# Patient Record
Sex: Male | Born: 1960 | Race: Black or African American | Hispanic: No | Marital: Married | Smoking: Never smoker
Health system: Southern US, Community
[De-identification: ages and names within clinical notes are randomized; demographics above are authoritative.]

## PROBLEM LIST (undated history)

## (undated) DIAGNOSIS — I10 Essential (primary) hypertension: Secondary | ICD-10-CM

## (undated) DIAGNOSIS — J45909 Unspecified asthma, uncomplicated: Secondary | ICD-10-CM

## (undated) HISTORY — PX: HERNIA REPAIR: SHX51

---

## 2004-10-04 ENCOUNTER — Emergency Department: Payer: Self-pay | Admitting: Emergency Medicine

## 2004-10-13 ENCOUNTER — Ambulatory Visit: Payer: Self-pay | Admitting: Family Medicine

## 2004-10-22 ENCOUNTER — Ambulatory Visit: Payer: Self-pay | Admitting: Family Medicine

## 2006-05-25 ENCOUNTER — Ambulatory Visit: Payer: Self-pay | Admitting: Family Medicine

## 2006-06-04 ENCOUNTER — Ambulatory Visit: Payer: Self-pay | Admitting: Family Medicine

## 2006-06-11 ENCOUNTER — Ambulatory Visit: Payer: Self-pay | Admitting: Family Medicine

## 2006-12-28 ENCOUNTER — Encounter (INDEPENDENT_AMBULATORY_CARE_PROVIDER_SITE_OTHER): Payer: Self-pay | Admitting: *Deleted

## 2007-01-31 ENCOUNTER — Telehealth: Payer: Self-pay | Admitting: Family Medicine

## 2007-03-01 ENCOUNTER — Encounter: Admission: RE | Admit: 2007-03-01 | Discharge: 2007-03-01 | Payer: Self-pay | Admitting: Internal Medicine

## 2007-05-18 ENCOUNTER — Emergency Department: Payer: Self-pay

## 2008-01-11 ENCOUNTER — Ambulatory Visit: Payer: Self-pay | Admitting: Orthopedic Surgery

## 2011-01-11 ENCOUNTER — Emergency Department: Payer: Self-pay | Admitting: Emergency Medicine

## 2011-12-01 ENCOUNTER — Emergency Department: Payer: Self-pay | Admitting: Emergency Medicine

## 2015-02-11 ENCOUNTER — Other Ambulatory Visit: Payer: Self-pay | Admitting: Internal Medicine

## 2015-02-12 ENCOUNTER — Encounter: Payer: Self-pay | Admitting: Internal Medicine

## 2015-02-12 ENCOUNTER — Other Ambulatory Visit: Payer: Self-pay | Admitting: Internal Medicine

## 2015-02-12 DIAGNOSIS — I10 Essential (primary) hypertension: Secondary | ICD-10-CM | POA: Insufficient documentation

## 2015-05-24 ENCOUNTER — Other Ambulatory Visit: Payer: Self-pay | Admitting: Internal Medicine

## 2015-05-28 ENCOUNTER — Ambulatory Visit (INDEPENDENT_AMBULATORY_CARE_PROVIDER_SITE_OTHER): Payer: BLUE CROSS/BLUE SHIELD | Admitting: Internal Medicine

## 2015-05-28 ENCOUNTER — Encounter: Payer: Self-pay | Admitting: Internal Medicine

## 2015-05-28 VITALS — BP 157/98 | HR 68 | Ht 72.0 in | Wt 223.0 lb

## 2015-05-28 DIAGNOSIS — Z1211 Encounter for screening for malignant neoplasm of colon: Secondary | ICD-10-CM

## 2015-05-28 DIAGNOSIS — I10 Essential (primary) hypertension: Secondary | ICD-10-CM

## 2015-05-28 MED ORDER — AMLODIPINE BESYLATE 5 MG PO TABS: 5.0000 mg | ORAL_TABLET | Freq: Every day | ORAL | Status: DC

## 2015-05-28 NOTE — Progress Notes (Signed)
Date:  05/28/2015   Name:  Alexander Davis   DOB:  03/10/1961   MRN:  [id number]   Chief Complaint: Follow-up and Hypertension Hypertension This is a recurrent problem. The current episode started more than 1 month ago. The problem has been gradually worsening since onset. The problem is uncontrolled. Pertinent negatives include no chest pain, headaches, palpitations or shortness of breath. Associated agents: herbal supplements. There are no known risk factors for coronary artery disease. Past treatments include calcium channel blockers and angiotensin blockers. The current treatment provides significant improvement.  He had been taking amlodipine and losartan up until about one year ago. He been exercising and watching his sodium intake and controlling his weight. His blood pressures were excellent. When he ran out of medication he just neglected to refill them. He continued to work on diet and exercise and monitor blood pressures. These remained 140/90 until 3 months ago. At that time he came under increased stress with the passing of his sister and his father as well as the loss of his job. He stopped exercising and neglected his diet. Several days ago he had an episode of lightheadedness which prompted him to check his blood pressure.    Review of Systems  Constitutional: Negative for fever, diaphoresis and unexpected weight change.  Eyes: Negative for visual disturbance.  Respiratory: Negative for chest tightness, shortness of breath and wheezing.   Cardiovascular: Negative for chest pain, palpitations and leg swelling.  Gastrointestinal: Negative for abdominal pain.  Neurological: Positive for light-headedness. Negative for headaches.    Patient Active Problem List   Diagnosis Date Noted  . Hypertension 02/12/2015    Prior to Admission medications   Medication Sig Start Date End Date Taking? Authorizing Provider  amLODipine (NORVASC) 5 MG tablet Take 1 tablet by mouth  daily. Reported on 05/28/2015 08/30/13   Historical Provider, MD  losartan (COZAAR) 100 MG tablet Take 1 tablet by mouth daily. Reported on 05/28/2015 08/30/13   Historical Provider, MD    Allergies  Allergen Reactions  . Penicillins Anaphylaxis    Past Surgical History  Procedure Laterality Date  . Hernia repair      age 32    Social History  Substance Use Topics  . Smoking status: Never Smoker   . Smokeless tobacco: None  . Alcohol Use: No     Medication list has been reviewed and updated.   Physical Exam  Constitutional: He is oriented to person, place, and time. He appears well-developed. No distress.  HENT:  Head: Normocephalic and atraumatic.  Right Ear: Tympanic membrane and ear canal normal.  Left Ear: Tympanic membrane and ear canal normal.  Mouth/Throat: Oropharynx is clear and moist.  Eyes: EOM are normal. Pupils are equal, round, and reactive to light.  Neck: Normal range of motion. Neck supple. No thyromegaly present.  Cardiovascular: Normal rate, regular rhythm and normal heart sounds.   Pulmonary/Chest: Effort normal and breath sounds normal. No respiratory distress. He has no wheezes.  Musculoskeletal: Normal range of motion. He exhibits no edema or tenderness.  Neurological: He is alert and oriented to person, place, and time. He has normal reflexes.  Skin: Skin is warm and dry. No rash noted.  Psychiatric: He has a normal mood and affect. His behavior is normal. Thought content normal.    BP 157/98 mmHg  Pulse 68  Ht 6' (1.829 m)  Wt 223 lb (101.152 kg)  BMI 30.24 kg/m2  Assessment and Plan: 1. Essential hypertension Resume single  agent and reassess next visit Avoid stimulants and herbal supplements  Bring labs done recently at work to next visit - amLODipine (NORVASC) 5 MG tablet; Take 1 tablet (5 mg total) by mouth daily. Reported on 05/28/2015  Dispense: 30 tablet; Refill: 3  2. Colon cancer screening Pt has colonoscopy scheduled next  month  Halina Maidens, MD [facility]  05/28/2015

## 2015-05-28 NOTE — Patient Instructions (Signed)
DASH Eating Plan  DASH stands for "Dietary Approaches to Stop Hypertension." The DASH eating plan is a healthy eating plan that has been shown to reduce high blood pressure (hypertension). Additional health benefits may include reducing the risk of type 2 diabetes mellitus, heart disease, and stroke. The DASH eating plan may also help with weight loss.  WHAT DO I NEED TO KNOW ABOUT THE DASH EATING PLAN?  For the DASH eating plan, you will follow these general guidelines:  · Choose foods with a percent daily value for sodium of less than 5% (as listed on the food label).  · Use salt-free seasonings or herbs instead of table salt or sea salt.  · Check with your health care provider or pharmacist before using salt substitutes.  · Eat lower-sodium products, often labeled as "lower sodium" or "no salt added."  · Eat fresh foods.  · Eat more vegetables, fruits, and low-fat dairy products.  · Choose whole grains. Look for the word "whole" as the first word in the ingredient list.  · Choose fish and skinless chicken or turkey more often than red meat. Limit fish, poultry, and meat to 6 oz (170 g) each day.  · Limit sweets, desserts, sugars, and sugary drinks.  · Choose heart-healthy fats.  · Limit cheese to 1 oz (28 g) per day.  · Eat more home-cooked food and less restaurant, buffet, and fast food.  · Limit fried foods.  · Cook foods using methods other than frying.  · Limit canned vegetables. If you do use them, rinse them well to decrease the sodium.  · When eating at a restaurant, ask that your food be prepared with less salt, or no salt if possible.  WHAT FOODS CAN I EAT?  Seek help from a dietitian for individual calorie needs.  Grains  Whole grain or whole wheat bread. Brown rice. Whole grain or whole wheat pasta. Quinoa, bulgur, and whole grain cereals. Low-sodium cereals. Corn or whole wheat flour tortillas. Whole grain cornbread. Whole grain crackers. Low-sodium crackers.  Vegetables  Fresh or frozen vegetables  (raw, steamed, roasted, or grilled). Low-sodium or reduced-sodium tomato and vegetable juices. Low-sodium or reduced-sodium tomato sauce and paste. Low-sodium or reduced-sodium canned vegetables.   Fruits  All fresh, canned (in natural juice), or frozen fruits.  Meat and Other Protein Products  Ground beef (85% or leaner), grass-fed beef, or beef trimmed of fat. Skinless chicken or turkey. Ground chicken or turkey. Pork trimmed of fat. All fish and seafood. Eggs. Dried beans, peas, or lentils. Unsalted nuts and seeds. Unsalted canned beans.  Dairy  Low-fat dairy products, such as skim or 1% milk, 2% or reduced-fat cheeses, low-fat ricotta or cottage cheese, or plain low-fat yogurt. Low-sodium or reduced-sodium cheeses.  Fats and Oils  Tub margarines without trans fats. Light or reduced-fat mayonnaise and salad dressings (reduced sodium). Avocado. Safflower, olive, or canola oils. Natural peanut or almond butter.  Other  Unsalted popcorn and pretzels.  The items listed above may not be a complete list of recommended foods or beverages. Contact your dietitian for more options.  WHAT FOODS ARE NOT RECOMMENDED?  Grains  White bread. White pasta. White rice. Refined cornbread. Bagels and croissants. Crackers that contain trans fat.  Vegetables  Creamed or fried vegetables. Vegetables in a cheese sauce. Regular canned vegetables. Regular canned tomato sauce and paste. Regular tomato and vegetable juices.  Fruits  Dried fruits. Canned fruit in light or heavy syrup. Fruit juice.  Meat and Other Protein   Products  Fatty cuts of meat. Ribs, chicken wings, bacon, sausage, bologna, salami, chitterlings, fatback, hot dogs, bratwurst, and packaged luncheon meats. Salted nuts and seeds. Canned beans with salt.  Dairy  Whole or 2% milk, cream, half-and-half, and cream cheese. Whole-fat or sweetened yogurt. Full-fat cheeses or blue cheese. Nondairy creamers and whipped toppings. Processed cheese, cheese spreads, or cheese  curds.  Condiments  Onion and garlic salt, seasoned salt, table salt, and sea salt. Canned and packaged gravies. Worcestershire sauce. Tartar sauce. Barbecue sauce. Teriyaki sauce. Soy sauce, including reduced sodium. Steak sauce. Fish sauce. Oyster sauce. Cocktail sauce. Horseradish. Ketchup and mustard. Meat flavorings and tenderizers. Bouillon cubes. Hot sauce. Tabasco sauce. Marinades. Taco seasonings. Relishes.  Fats and Oils  Butter, stick margarine, lard, shortening, ghee, and bacon fat. Coconut, palm kernel, or palm oils. Regular salad dressings.  Other  Pickles and olives. Salted popcorn and pretzels.  The items listed above may not be a complete list of foods and beverages to avoid. Contact your dietitian for more information.  WHERE CAN I FIND MORE INFORMATION?  National Heart, Lung, and [employer] Institute: www.nhlbi.nih.gov/health/health-topics/topics/dash/     This information is not intended to replace advice given to you by your health care provider. Make sure you discuss any questions you have with your health care provider.     Document Released: 04/02/2011 Document Revised: 05/04/2014 Document Reviewed: 02/15/2013  Elsevier Interactive Patient Education ©2016 Elsevier Inc[employer]

## 2015-06-19 ENCOUNTER — Encounter: Payer: Self-pay | Admitting: Internal Medicine

## 2015-06-19 ENCOUNTER — Ambulatory Visit (INDEPENDENT_AMBULATORY_CARE_PROVIDER_SITE_OTHER): Payer: BLUE CROSS/BLUE SHIELD | Admitting: Internal Medicine

## 2015-06-19 VITALS — BP 124/88 | HR 76 | Ht 72.0 in | Wt 223.0 lb

## 2015-06-19 DIAGNOSIS — N451 Epididymitis: Secondary | ICD-10-CM

## 2015-06-19 MED ORDER — CIPROFLOXACIN HCL 500 MG PO TABS: 500.0000 mg | ORAL_TABLET | Freq: Two times a day (BID) | ORAL | Status: DC

## 2015-06-19 NOTE — Patient Instructions (Signed)
Epididymitis °Epididymitis is swelling (inflammation) of the epididymis. The epididymis is a cord-like structure that is located along the top and back part of the testicle. It collects and stores sperm from the testicle. °This condition can also cause pain and swelling of the testicle and scrotum. Symptoms usually start suddenly (acute epididymitis). Sometimes epididymitis starts gradually and lasts for a while (chronic epididymitis). This type may be harder to treat. °CAUSES °In men 35 and younger, this condition is usually caused by a bacterial infection or sexually transmitted disease (STD), such as: °· Gonorrhea. °· Chlamydia.   °In men 35 and older who do not have anal sex, this condition is usually caused by bacteria from a blockage or abnormalities in the urinary system. These can result from: °· Having a tube placed into the bladder (urinary catheter). °· Having an enlarged or inflamed prostate gland. °· Having recent urinary tract surgery. °In men who have a condition that weakens the body's defense system (immune system), such as HIV, this condition can be caused by:  °· Other bacteria, including tuberculosis and syphilis. °· Viruses. °· Fungi. °Sometimes this condition occurs without infection. That may happen if urine flows backward into the epididymis after heavy lifting or straining. °RISK FACTORS °This condition is more likely to develop in men: °· Who have unprotected sex with more than one partner. °· Who have anal sex.   °· Who have recently had surgery.   °· Who have a urinary catheter. °· Who have urinary problems. °· Who have a suppressed immune system.   °SYMPTOMS  °This condition usually begins suddenly with chills, fever, and pain behind the scrotum and in the testicle. Other symptoms include:  °· Swelling of the scrotum, testicle, or both. °· Pain when ejaculating or urinating. °· Pain in the back or belly. °· Nausea. °· Itching and discharge from the penis. °· Frequent need to pass  urine. °· Redness and tenderness of the scrotum. °DIAGNOSIS °Your health care provider can diagnose this condition based on your symptoms and medical history. Your health care provider will also do a physical exam to ask about your symptoms and check your scrotum and testicle for swelling, pain, and redness. You may also have other tests, including:   °· Examination of discharge from the penis. °· Urine tests for infections, such as STDs.   °Your health care provider may test you for other STDs, including HIV.  °TREATMENT °Treatment for this condition depends on the cause. If your condition is caused by a bacterial infection, oral antibiotic medicine may be prescribed. If the bacterial infection has spread to your blood, you may need to receive IV antibiotics. Nonbacterial epididymitis is treated with home care that includes bed rest and elevation of the scrotum. °Surgery may be needed to treat: °· Bacterial epididymitis that causes pus to build up in the scrotum (abscess). °· Chronic epididymitis that has not responded to other treatments. °HOME CARE INSTRUCTIONS °Medicines  °· Take over-the-counter and prescription medicines only as told by your health care provider.   °· If you were prescribed an antibiotic medicine, take it as told by your health care provider. Do not stop taking the antibiotic even if your condition improves. °Sexual Activity  °· If your epididymitis was caused by an STD, avoid sexual activity until your treatment is complete. °· Inform your sexual partner or partners if you test positive for an STD. They may need to be treated. Do not engage in sexual activity with your partner or partners until their treatment is completed. °General Instructions  °· Return to your normal activities as told   by your health care provider. Ask your health care provider what activities are safe for you. °· Keep your scrotum elevated and supported while resting. Ask your health care provider if you should wear a  scrotal support, such as a jockstrap. Wear it as told by your health care provider. °· If directed, apply ice to the affected area:   °¨ Put ice in a plastic bag. °¨ Place a towel between your skin and the bag. °¨ Leave the ice on for 20 minutes, 2-3 times per day. °· Try taking a sitz bath to help with discomfort. This is a warm water bath that is taken while you are sitting down. The water should only come up to your hips and should cover your buttocks. Do this 3-4 times per day or as told by your health care provider. °· Keep all follow-up visits as told by your health care provider. This is important. °SEEK MEDICAL CARE IF:  °· You have a fever.   °· Your pain medicine is not helping.   °· Your pain is getting worse.   °· Your symptoms do not improve within three days. °  °This information is not intended to replace advice given to you by your health care provider. Make sure you discuss any questions you have with your health care provider. °  °Document Released: 04/10/2000 Document Revised: 01/02/2015 Document Reviewed: 08/29/2014 °Elsevier Interactive Patient Education ©2016 Elsevier Inc. ° °

## 2015-06-19 NOTE — Progress Notes (Signed)
    Date:  06/19/2015   Name:  Alexander Davis   DOB:  [birth year]   MRN:  [id number]   Chief Complaint: Testicle Pain Testicle Pain The patient's primary symptoms include testicular pain. The patient's pertinent negatives include no genital injury, genital itching, genital lesions, penile discharge, penile pain, priapism or scrotal swelling. This is a new problem. The problem occurs intermittently. The problem has been waxing and waning. The pain is mild. Pertinent negatives include no chest pain, chills, coughing, discolored urine, dysuria, fever, flank pain, hematuria, painful intercourse, rash, shortness of breath, urgency or urinary retention. There is no reported injury. The testicular pain affects the left testicle. The color of the testicles is normal. Nothing aggravates the symptoms. He has tried nothing for the symptoms.    Review of Systems  Constitutional: Negative for fever, chills, diaphoresis and fatigue.  HENT: Negative for hearing loss.   Eyes: Negative for visual disturbance.  Respiratory: Negative for cough, chest tightness and shortness of breath.   Cardiovascular: Negative for chest pain and palpitations.  Genitourinary: Positive for testicular pain. Negative for dysuria, urgency, hematuria, flank pain, decreased urine volume, discharge, penile swelling, scrotal swelling, genital sores and penile pain.  Musculoskeletal: Negative for back pain.  Skin: Negative for rash.    Patient Active Problem List   Diagnosis Date Noted  . Hypertension 02/12/2015    Prior to Admission medications   Medication Sig Start Date End Date Taking? Authorizing Provider  amLODipine (NORVASC) 5 MG tablet Take 1 tablet (5 mg total) by mouth daily. Reported on 05/28/2015 05/28/15  Yes Glean Hess, MD  b complex vitamins capsule Take 1 capsule by mouth daily.   Yes Historical Provider, MD  Nettle, Urtica Dioica, (NETTLE LEAF PO) Take by mouth.   Yes Historical Provider, MD  OLIVE LEAF  PO Take by mouth.   Yes Historical Provider, MD  selenium 50 MCG TABS tablet Take 50 mcg by mouth daily.   Yes Historical Provider, MD  Turmeric, Lear Ng, (CURCUMIN) POWD by Does not apply route.   Yes Historical Provider, MD  vitamin E 100 UNIT capsule Take by mouth daily.   Yes Historical Provider, MD    Allergies  Allergen Reactions  . Penicillins Anaphylaxis    Past Surgical History  Procedure Laterality Date  . Hernia repair      age 55    Social History  Substance Use Topics  . Smoking status: Never Smoker   . Smokeless tobacco: None  . Alcohol Use: No    Medication list has been reviewed and updated.   Physical Exam  Constitutional: He appears well-developed and well-nourished. No distress.  Cardiovascular: Normal rate, regular rhythm and normal heart sounds.   Pulmonary/Chest: Effort normal and breath sounds normal.  Abdominal: Soft. Bowel sounds are normal. He exhibits no distension. There is tenderness in the right lower quadrant.      BP 124/88 mmHg  Pulse 76  Ht 6' (1.829 m)  Wt 223 lb (101.152 kg)  BMI 30.24 kg/m2  Assessment and Plan: 1. Epididymitis Based on symptoms Call for Urology referral if symptoms are persistent - ciprofloxacin (CIPRO) 500 MG tablet; Take 1 tablet (500 mg total) by mouth 2 (two) times daily.  Dispense: 20 tablet; Refill: 0   Halina Maidens, MD [facility]  06/19/2015

## 2015-07-26 ENCOUNTER — Ambulatory Visit: Payer: BLUE CROSS/BLUE SHIELD | Admitting: Internal Medicine

## 2015-10-11 ENCOUNTER — Other Ambulatory Visit: Payer: Self-pay | Admitting: Internal Medicine

## 2015-11-12 NOTE — Telephone Encounter (Signed)
Patient scheduled htn follow up on 7/24 @ 915 am.

## 2015-11-18 ENCOUNTER — Ambulatory Visit (INDEPENDENT_AMBULATORY_CARE_PROVIDER_SITE_OTHER): Payer: BLUE CROSS/BLUE SHIELD | Admitting: Internal Medicine

## 2015-11-18 ENCOUNTER — Encounter: Payer: Self-pay | Admitting: Internal Medicine

## 2015-11-18 VITALS — BP 142/90 | HR 72 | Ht 72.0 in | Wt 225.8 lb

## 2015-11-18 DIAGNOSIS — R1310 Dysphagia, unspecified: Secondary | ICD-10-CM | POA: Insufficient documentation

## 2015-11-18 DIAGNOSIS — N529 Male erectile dysfunction, unspecified: Secondary | ICD-10-CM

## 2015-11-18 DIAGNOSIS — I10 Essential (primary) hypertension: Secondary | ICD-10-CM

## 2015-11-18 MED ORDER — AMLODIPINE BESYLATE 5 MG PO TABS: 5.0000 mg | ORAL_TABLET | Freq: Every day | ORAL | 5 refills | Status: DC

## 2015-11-18 NOTE — Patient Instructions (Signed)
Take Viagra 30 minutes prior to anticipated activity. Call for prescription if helpful.

## 2015-11-18 NOTE — Progress Notes (Signed)
Date:  11/18/2015   Name:  Alexander Davis   DOB:  02-26-1961   MRN:  [id number]   Chief Complaint: Hypertension He complains of trouble swallowing pills.  This happens intermittently - feels like a pill is stuck in his upper chest.  He has to regurigate fluid and pills fragments. He admits to taking multiple pills at one time.  It rarely happens with food.   Hypertension  This is a chronic problem. The current episode started more than 1 year ago. The problem has been waxing and waning since onset. The problem is resistant. Pertinent negatives include no chest pain, palpitations or shortness of breath. There are no known risk factors for coronary artery disease. Past treatments include calcium channel blockers.  Erectile Dysfunction  This is a new problem. The problem is unchanged. The nature of his difficulty is maintaining erection. He reports his erection duration to be 5 to 10 minutes. Irritative symptoms do not include frequency or nocturia. Obstructive symptoms do not include incomplete emptying. Pertinent negatives include no chills. Nothing aggravates the symptoms. Past treatments include nothing.      Review of Systems  Constitutional: Negative for chills, fatigue and unexpected weight change.  HENT: Positive for trouble swallowing. Negative for sore throat.   Eyes: Negative for visual disturbance.  Respiratory: Negative for cough, chest tightness, shortness of breath and wheezing.   Cardiovascular: Negative for chest pain, palpitations and leg swelling.  Gastrointestinal: Negative for abdominal pain.  Genitourinary: Negative for frequency, incomplete emptying, nocturia, penile pain, penile swelling and testicular pain.    Patient Active Problem List   Diagnosis Date Noted  . Hypertension 02/12/2015    Prior to Admission medications   Medication Sig Start Date End Date Taking? Authorizing Provider  amLODipine (NORVASC) 5 MG tablet TAKE ONE TABLET BY MOUTH ONCE  DAILY 10/11/15  Yes Glean Hess, MD  b complex vitamins capsule Take 1 capsule by mouth daily.   Yes Historical Provider, MD  Nettle, Urtica Dioica, (NETTLE LEAF PO) Take by mouth.   Yes Historical Provider, MD  OLIVE LEAF PO Take by mouth.   Yes Historical Provider, MD  selenium 50 MCG TABS tablet Take 50 mcg by mouth daily.   Yes Historical Provider, MD  Turmeric, Lear Ng, (CURCUMIN) POWD by Does not apply route.   Yes Historical Provider, MD  vitamin E 100 UNIT capsule Take by mouth daily.   Yes Historical Provider, MD    Allergies  Allergen Reactions  . Penicillins Anaphylaxis    Past Surgical History:  Procedure Laterality Date  . HERNIA REPAIR     age 15    Social History  Substance Use Topics  . Smoking status: Never Smoker  . Smokeless tobacco: Not on file  . Alcohol use No     Medication list has been reviewed and updated.   Physical Exam  Constitutional: He is oriented to person, place, and time. He appears well-developed. No distress.  HENT:  Head: Normocephalic and atraumatic.  Neck: Normal range of motion. Neck supple.  Cardiovascular: Normal rate, regular rhythm and normal heart sounds.   Pulmonary/Chest: Effort normal and breath sounds normal. No respiratory distress.  Abdominal: Soft. Bowel sounds are normal. There is no tenderness.  Musculoskeletal: Normal range of motion. He exhibits no edema.  Neurological: He is alert and oriented to person, place, and time.  Skin: Skin is warm and dry. No rash noted.  Psychiatric: He has a normal mood and affect. His behavior  is normal. Thought content normal.  Nursing note and vitals reviewed.   BP (!) 142/98 (BP Location: Right Arm, Patient Position: Sitting, Cuff Size: Large)   Pulse 72   Ht 6' (1.829 m)   Wt 225 lb 12.8 oz (102.4 kg)   BMI 30.62 kg/m   Assessment and Plan: 1. Essential hypertension Fair control - continue current medication - amLODipine (NORVASC) 5 MG tablet; Take 1 tablet (5  mg total) by mouth daily.  Dispense: 30 tablet; Refill: 5  2. Pill dysphagia Recommend taking pills individually If symptoms worsen, return for follow up  3. Erectile dysfunction, unspecified erectile dysfunction type Samples of Viagra 50 mg given   Halina Maidens, MD [facility]  11/18/2015

## 2016-08-17 ENCOUNTER — Other Ambulatory Visit: Payer: Self-pay | Admitting: Internal Medicine

## 2016-08-17 DIAGNOSIS — I10 Essential (primary) hypertension: Secondary | ICD-10-CM

## 2016-08-17 NOTE — Telephone Encounter (Signed)
Informed pt on VM needs to call and schedule appt to be seen with labs-

## 2017-01-14 ENCOUNTER — Telehealth: Payer: Self-pay

## 2017-01-14 NOTE — Telephone Encounter (Signed)
Patient called and left Vm on nurse line-  Stated he feels "discriminated against" because when we address him in VM or call him back to be roomed in our office we call him Lesslie and not Mr Muscatello. He seemed very upset saying someone called and left him a message and did not address him as MR. I gave this information to our office manager AMY who fixed the patients preferred name to Mr Ellner.

## 2017-01-19 ENCOUNTER — Ambulatory Visit (INDEPENDENT_AMBULATORY_CARE_PROVIDER_SITE_OTHER): Payer: BC Managed Care – PPO | Admitting: Internal Medicine

## 2017-01-19 ENCOUNTER — Encounter: Payer: Self-pay | Admitting: Internal Medicine

## 2017-01-19 ENCOUNTER — Other Ambulatory Visit
Admission: RE | Admit: 2017-01-19 | Discharge: 2017-01-19 | Disposition: A | Discharge status: Home / Self Care | Payer: BC Managed Care – PPO | Source: Ambulatory Visit | Attending: Internal Medicine | Admitting: Internal Medicine

## 2017-01-19 VITALS — BP 142/92 | HR 75 | Ht 72.0 in | Wt 234.0 lb

## 2017-01-19 DIAGNOSIS — D485 Neoplasm of uncertain behavior of skin: Secondary | ICD-10-CM | POA: Insufficient documentation

## 2017-01-19 DIAGNOSIS — Z8042 Family history of malignant neoplasm of prostate: Secondary | ICD-10-CM | POA: Insufficient documentation

## 2017-01-19 DIAGNOSIS — I10 Essential (primary) hypertension: Secondary | ICD-10-CM | POA: Diagnosis not present

## 2017-01-19 LAB — CBC WITH DIFFERENTIAL/PLATELET
BASOS PCT: 1 %
Basophils Absolute: 0 10*3/uL (ref 0–0.1)
EOS ABS: 0.3 10*3/uL (ref 0–0.7)
Eosinophils Relative: 5 %
HCT: 38.9 % — ABNORMAL LOW (ref 40.0–52.0)
HEMOGLOBIN: 13 g/dL (ref 13.0–18.0)
LYMPHS ABS: 1.6 10*3/uL (ref 1.0–3.6)
Lymphocytes Relative: 29 %
MCH: 31.3 pg (ref 26.0–34.0)
MCHC: 33.5 g/dL (ref 32.0–36.0)
MCV: 93.5 fL (ref 80.0–100.0)
MONOS PCT: 7 %
Monocytes Absolute: 0.4 10*3/uL (ref 0.2–1.0)
NEUTROS ABS: 3.2 10*3/uL (ref 1.4–6.5)
NEUTROS PCT: 58 %
PLATELETS: 138 10*3/uL — AB (ref 150–440)
RBC: 4.16 MIL/uL — ABNORMAL LOW (ref 4.40–5.90)
RDW: 12.6 % (ref 11.5–14.5)
WBC: 5.5 10*3/uL (ref 3.8–10.6)

## 2017-01-19 LAB — COMPREHENSIVE METABOLIC PANEL
ALK PHOS: 110 U/L (ref 38–126)
ALT: 28 U/L (ref 17–63)
ANION GAP: 9 (ref 5–15)
AST: 30 U/L (ref 15–41)
Albumin: 3.8 g/dL (ref 3.5–5.0)
BUN: 9 mg/dL (ref 6–20)
CALCIUM: 8.9 mg/dL (ref 8.9–10.3)
CO2: 26 mmol/L (ref 22–32)
Chloride: 104 mmol/L (ref 101–111)
Creatinine, Ser: 0.95 mg/dL (ref 0.61–1.24)
GFR calc non Af Amer: >60 mL/min (ref >60)
Glucose, Bld: 119 mg/dL — ABNORMAL HIGH (ref 65–99)
POTASSIUM: 3.8 mmol/L (ref 3.5–5.1)
SODIUM: 139 mmol/L (ref 135–145)
Total Bilirubin: 0.9 mg/dL (ref 0.3–1.2)
Total Protein: 7.8 g/dL (ref 6.5–8.1)

## 2017-01-19 MED ORDER — AMLODIPINE BESYLATE 5 MG PO TABS: 5.0000 mg | ORAL_TABLET | Freq: Every day | ORAL | 1 refills | Status: DC

## 2017-01-19 NOTE — Patient Instructions (Addendum)
BP goal <135/85 DASH Eating Plan DASH stands for "Dietary Approaches to Stop Hypertension." The DASH eating plan is a healthy eating plan that has been shown to reduce high blood pressure (hypertension). It may also reduce your risk for type 2 diabetes, heart disease, and stroke. The DASH eating plan may also help with weight loss. What are tips for following this plan? General guidelines  Avoid eating more than 2,300 mg (milligrams) of salt (sodium) a day. If you have hypertension, you may need to reduce your sodium intake to 1,500 mg a day.  Limit alcohol intake to no more than 1 drink a day for nonpregnant women and 2 drinks a day for men. One drink equals 12 oz of beer, 5 oz of wine, or 1 oz of hard liquor.  Work with your health care provider to maintain a healthy body weight or to lose weight. Ask what an ideal weight is for you.  Get at least 30 minutes of exercise that causes your heart to beat faster (aerobic exercise) most days of the week. Activities may include walking, swimming, or biking.  Work with your health care provider or diet and nutrition specialist (dietitian) to adjust your eating plan to your individual calorie needs. Reading food labels  Check food labels for the amount of sodium per serving. Choose foods with less than 5 percent of the Daily Value of sodium. Generally, foods with less than 300 mg of sodium per serving fit into this eating plan.  To find whole grains, look for the word "whole" as the first word in the ingredient list. Shopping  Buy products labeled as "low-sodium" or "no salt added."  Buy fresh foods. Avoid canned foods and premade or frozen meals. Cooking  Avoid adding salt when cooking. Use salt-free seasonings or herbs instead of table salt or sea salt. Check with your health care provider or pharmacist before using salt substitutes.  Do not fry foods. Cook foods using healthy methods such as baking, boiling, grilling, and broiling  instead.  Cook with heart-healthy oils, such as olive, canola, soybean, or sunflower oil. Meal planning   Eat a balanced diet that includes: ? 5 or more servings of fruits and vegetables each day. At each meal, try to fill half of your plate with fruits and vegetables. ? Up to 6-8 servings of whole grains each day. ? Less than 6 oz of lean meat, poultry, or fish each day. A 3-oz serving of meat is about the same size as a deck of cards. One egg equals 1 oz. ? 2 servings of low-fat dairy each day. ? A serving of nuts, seeds, or beans 5 times each week. ? Heart-healthy fats. Healthy fats called Omega-3 fatty acids are found in foods such as flaxseeds and coldwater fish, like sardines, salmon, and mackerel.  Limit how much you eat of the following: ? Canned or prepackaged foods. ? Food that is high in trans fat, such as fried foods. ? Food that is high in saturated fat, such as fatty meat. ? Sweets, desserts, sugary drinks, and other foods with added sugar. ? Full-fat dairy products.  Do not salt foods before eating.  Try to eat at least 2 vegetarian meals each week.  Eat more home-cooked food and less restaurant, buffet, and fast food.  When eating at a restaurant, ask that your food be prepared with less salt or no salt, if possible. What foods are recommended? The items listed may not be a complete list. Talk with your  dietitian about what dietary choices are best for you. Grains Whole-grain or whole-wheat bread. Whole-grain or whole-wheat pasta. Brown rice. [place]row. Bulgur. Whole-grain and low-sodium cereals. Pita bread. Low-fat, low-sodium crackers. Whole-wheat flour tortillas. Vegetables Fresh or frozen vegetables (raw, steamed, roasted, or grilled). Low-sodium or reduced-sodium tomato and vegetable juice. Low-sodium or reduced-sodium tomato sauce and tomato paste. Low-sodium or reduced-sodium canned vegetables. Fruits All fresh, dried, or frozen fruit. Canned fruit in  natural juice (without added sugar). Meat and other protein foods Skinless chicken or [place]. Ground chicken or [place]. Pork with fat trimmed off. Fish and seafood. Egg whites. Dried beans, peas, or lentils. Unsalted nuts, nut butters, and seeds. Unsalted canned beans. Lean cuts of beef with fat trimmed off. Low-sodium, lean deli meat. Dairy Low-fat (1%) or fat-free (skim) milk. Fat-free, low-fat, or reduced-fat cheeses. Nonfat, low-sodium ricotta or cottage cheese. Low-fat or nonfat yogurt. Low-fat, low-sodium cheese. Fats and oils Soft margarine without trans fats. Vegetable oil. Low-fat, reduced-fat, or light mayonnaise and salad dressings (reduced-sodium). Canola, safflower, olive, soybean, and sunflower oils. Avocado. Seasoning and other foods Herbs. Spices. Seasoning mixes without salt. Unsalted popcorn and pretzels. Fat-free sweets. What foods are not recommended? The items listed may not be a complete list. Talk with your dietitian about what dietary choices are best for you. Grains Baked goods made with fat, such as croissants, muffins, or some breads. Dry pasta or rice meal packs. Vegetables Creamed or fried vegetables. Vegetables in a cheese sauce. Regular canned vegetables (not low-sodium or reduced-sodium). Regular canned tomato sauce and paste (not low-sodium or reduced-sodium). Regular tomato and vegetable juice (not low-sodium or reduced-sodium). Angie Fava. Olives. Fruits Canned fruit in a light or heavy syrup. Fried fruit. Fruit in cream or butter sauce. Meat and other protein foods Fatty cuts of meat. Ribs. Fried meat. Berniece Salines. Sausage. Bologna and other processed lunch meats. Salami. Fatback. Hotdogs. Bratwurst. Salted nuts and seeds. Canned beans with added salt. Canned or smoked fish. Whole eggs or egg yolks. Chicken or [place] with skin. Dairy Whole or 2% milk, cream, and half-and-half. Whole or full-fat cream cheese. Whole-fat or sweetened yogurt. Full-fat cheese. Nondairy  creamers. Whipped toppings. Processed cheese and cheese spreads. Fats and oils Butter. Stick margarine. Lard. Shortening. Ghee. Bacon fat. Tropical oils, such as coconut, palm kernel, or palm oil. Seasoning and other foods Salted popcorn and pretzels. Onion salt, garlic salt, seasoned salt, table salt, and sea salt. Worcestershire sauce. Tartar sauce. Barbecue sauce. Teriyaki sauce. Soy sauce, including reduced-sodium. Steak sauce. Canned and packaged gravies. Fish sauce. Oyster sauce. Cocktail sauce. Horseradish that you find on the shelf. Ketchup. Mustard. Meat flavorings and tenderizers. Bouillon cubes. Hot sauce and Tabasco sauce. Premade or packaged marinades. Premade or packaged taco seasonings. Relishes. Regular salad dressings. Where to find more information:  National Heart, Lung, and [place]: https://wilson-eaton.com/  American Heart Association: www.heart.org Summary  The DASH eating plan is a healthy eating plan that has been shown to reduce high blood pressure (hypertension). It may also reduce your risk for type 2 diabetes, heart disease, and stroke.  With the DASH eating plan, you should limit salt (sodium) intake to 2,300 mg a day. If you have hypertension, you may need to reduce your sodium intake to 1,500 mg a day.  When on the DASH eating plan, aim to eat more fresh fruits and vegetables, whole grains, lean proteins, low-fat dairy, and heart-healthy fats.  Work with your health care provider or diet and nutrition specialist (dietitian) to adjust your eating plan  to your individual calorie needs. This information is not intended to replace advice given to you by your health care provider. Make sure you discuss any questions you have with your health care provider. Document Released: 04/02/2011 Document Revised: 04/06/2016 Document Reviewed: 04/06/2016 Elsevier Interactive Patient Education  2017 [employer].

## 2017-01-19 NOTE — Progress Notes (Signed)
Date:  01/19/2017   Name:  Alexander Davis   DOB:  [birth year]   MRN:  [id number]   Chief Complaint: Hypertension (Declined Flu Shot. States his BP does run high at home. Ran out of meds but now taking again daily. ) and Mass (Noticed a spot on pelvic region. He believes this is a keloid. Wanted Dr to take a look at it, or wanted referral if needed. )  Hypertension  This is a chronic problem. The problem has been gradually worsening since onset. The problem is uncontrolled (he ran out of meds for 2 weeks then restarted less than one week ago). Pertinent negatives include no chest pain, headaches, palpitations or shortness of breath.   Skin lesion - he has a large skin lesion in the suprapubic area that he feels might be a mole or a keloid. Interestingly he developed keloid on his anterior chest and was seen by dermatology about 1 month ago. They injected with cortisone and it has reduced in size. He did not mention the pubic lesion at that time because he forgot. He is here today with his wife to discuss his problem to see what referrals are needed.  It is not painful or bleeding and has been present for more than one year.  Review of Systems  Constitutional: Negative for chills, fatigue and fever.  Respiratory: Negative for cough, chest tightness and shortness of breath.   Cardiovascular: Negative for chest pain, palpitations and leg swelling.  Skin: Negative for color change.       Raised lesion in pubic area  Neurological: Negative for dizziness and headaches.    Patient Active Problem List   Diagnosis Date Noted  . Pill dysphagia 11/18/2015  . Erectile dysfunction 11/18/2015  . Hypertension 02/12/2015    Prior to Admission medications   Medication Sig Start Date End Date Taking? Authorizing Provider  amLODipine (NORVASC) 5 MG tablet TAKE ONE TABLET BY MOUTH ONCE DAILY 08/17/16  Yes Glean Hess, MD  b complex vitamins capsule Take 1 capsule by mouth daily.   Yes  [provider]  cholecalciferol (VITAMIN D) 1000 units tablet Take 1,000 Units by mouth daily.   Yes [provider]  Multiple Vitamin (MULTIVITAMIN) capsule Take 1 capsule by mouth daily.   Yes [provider]  Nettle, Urtica Dioica, (NETTLE LEAF PO) Take by mouth.   Yes [provider]  OLIVE LEAF PO Take by mouth.   Yes [provider]  selenium 50 MCG TABS tablet Take 50 mcg by mouth daily.   Yes [provider]  Turmeric, Lear Ng, (CURCUMIN) POWD by Does not apply route.   Yes [provider]  vitamin E 100 UNIT capsule Take by mouth daily.   Yes [provider]    Allergies  Allergen Reactions  . Penicillins Anaphylaxis    Past Surgical History:  Procedure Laterality Date  . HERNIA REPAIR     age 65    Social History  Substance Use Topics  . Smoking status: Never Smoker  . Smokeless tobacco: Never Used  . Alcohol use No     Medication list has been reviewed and updated.  PHQ 2/9 Scores 01/19/2017  PHQ - 2 Score 0    Physical Exam  Constitutional: He is oriented to person, place, and time. He appears well-developed. No distress.  HENT:  Head: Normocephalic and atraumatic.  Neck: Normal range of motion. Neck supple. Carotid bruit is not present.  Cardiovascular: Normal rate,  regular rhythm and normal heart sounds.   Pulmonary/Chest: Effort normal and breath sounds normal. No respiratory distress. He has no wheezes.  Musculoskeletal: Normal range of motion.  Neurological: He is alert and oriented to person, place, and time.  Skin: Skin is warm and dry. No rash noted.     Psychiatric: He has a normal mood and affect. His speech is normal and behavior is normal. Thought content normal.  Nursing note and vitals reviewed.   BP (!) 152/100 (BP Location: Left Arm, Patient Position: Sitting, Cuff Size: Large)   Pulse 75   Ht 6' (1.829 m)   Wt 234 lb (106.1 kg)   SpO2 99%   BMI 31.74 kg/m     Assessment and Plan: 1. Essential hypertension Monitor BP home and call or return if not at goal in the next three weeks <135/85 - amLODipine (NORVASC) 5 MG tablet; Take 1 tablet (5 mg total) by mouth daily.  Dispense: 90 tablet; Refill: 1 - CBC with Differential/Platelet - Comprehensive metabolic panel - TSH  2. Neoplasm of uncertain behavior of skin Recommend consultation with Dr. Keene Breath  3. Family history of prostate cancer in father Will need PSA and DRE at [facility]   Meds ordered this encounter  Medications  . amLODipine (NORVASC) 5 MG tablet    Sig: Take 1 tablet (5 mg total) by mouth daily.    Dispense:  90 tablet    Refill:  1    Partially dictated using Editor, commissioning. Any errors are unintentional.  Halina Maidens, MD [facility]  01/19/2017

## 2017-01-20 LAB — TSH: TSH: 1.481 u[IU]/mL (ref 0.350–4.500)

## 2017-02-15 ENCOUNTER — Other Ambulatory Visit: Payer: Self-pay

## 2017-02-15 DIAGNOSIS — I10 Essential (primary) hypertension: Secondary | ICD-10-CM

## 2017-02-15 DIAGNOSIS — K625 Hemorrhage of anus and rectum: Secondary | ICD-10-CM

## 2017-08-09 ENCOUNTER — Encounter (HOSPITAL_COMMUNITY): Payer: Self-pay | Admitting: Family Medicine

## 2017-08-09 ENCOUNTER — Emergency Department (HOSPITAL_COMMUNITY)
Admission: EM | Admit: 2017-08-09 | Discharge: 2017-08-09 | Disposition: A | Discharge status: Home / Self Care | Payer: BC Managed Care – PPO | Attending: Emergency Medicine | Admitting: Emergency Medicine

## 2017-08-09 ENCOUNTER — Encounter: Payer: Self-pay | Admitting: Internal Medicine

## 2017-08-09 ENCOUNTER — Ambulatory Visit: Payer: BC Managed Care – PPO | Admitting: Internal Medicine

## 2017-08-09 VITALS — BP 142/98 | HR 61 | Ht 72.0 in | Wt 231.0 lb

## 2017-08-09 DIAGNOSIS — R040 Epistaxis: Secondary | ICD-10-CM | POA: Diagnosis not present

## 2017-08-09 DIAGNOSIS — I10 Essential (primary) hypertension: Secondary | ICD-10-CM | POA: Insufficient documentation

## 2017-08-09 DIAGNOSIS — Z79899 Other long term (current) drug therapy: Secondary | ICD-10-CM | POA: Insufficient documentation

## 2017-08-09 DIAGNOSIS — R55 Syncope and collapse: Secondary | ICD-10-CM | POA: Diagnosis not present

## 2017-08-09 HISTORY — DX: Unspecified asthma, uncomplicated: J45.909

## 2017-08-09 HISTORY — DX: Essential (primary) hypertension: I10

## 2017-08-09 LAB — I-STAT CHEM 8, ED
BUN: 14 mg/dL (ref 6–20)
CHLORIDE: 107 mmol/L (ref 101–111)
Calcium, Ion: 1.15 mmol/L (ref 1.15–1.40)
Creatinine, Ser: 1.1 mg/dL (ref 0.61–1.24)
Glucose, Bld: 107 mg/dL — ABNORMAL HIGH (ref 65–99)
HCT: 35 % — ABNORMAL LOW (ref 39.0–52.0)
HEMOGLOBIN: 11.9 g/dL — AB (ref 13.0–17.0)
POTASSIUM: 4 mmol/L (ref 3.5–5.1)
SODIUM: 142 mmol/L (ref 135–145)
TCO2: 27 mmol/L (ref 22–32)

## 2017-08-09 MED ORDER — SODIUM CHLORIDE 0.9 % IV BOLUS
2000.0000 mL | Freq: Once | INTRAVENOUS | Status: AC
  Administered 2017-08-09: 2000 mL via INTRAVENOUS

## 2017-08-09 MED ORDER — AMLODIPINE BESYLATE 5 MG PO TABS: 5.0000 mg | ORAL_TABLET | Freq: Every day | ORAL | 1 refills | Status: DC

## 2017-08-09 NOTE — ED Provider Notes (Signed)
Care transferred to me from Dr. Leonette Monarch.  Patient is feeling much better after IV fluids.  I ambulated the patient and he states he is slightly dizzy but walks normally and with no ataxia.  He feels well enough to go home.  He has hypertension but heart rate is normal and otherwise appears well.  Discharge home to follow-up with ENT.  Discussed strict return precautions.   Sherwood Gambler, MD 08/09/17 [phone]

## 2017-08-09 NOTE — ED Notes (Signed)
Bed: WTR5 Expected date:  Expected time:  Means of arrival:  Comments: EMS-nose bleed

## 2017-08-09 NOTE — ED Triage Notes (Addendum)
Per EMs- Patient has a nose bleeding and lost 750 ml while en route Patient had a large amount of blood loss at home. Patient c/o headache and feeling tired. Also. EMS states that they gave Afrin 6 sprays prior to arrival to the ED. Blood flow slightly decreased.

## 2017-08-09 NOTE — Progress Notes (Signed)
Date:  08/09/2017   Name:  Alexander Davis   DOB:  05-Nov-1960   MRN:  [id number]   Chief Complaint: Epistaxis (Started yesterday. Was working on [employer], nature outside. When going in the house and walking up the stairs, right nostril started having nose bleed. A few hours before had some dizziness for a split second while in the store. Blood was pouring at a high rate. Lasted for atleast 40 mins. Last nose bleed as 2 years ago. ) Epistaxis   The bleeding has been from the right nare. This is a new problem. The current episode started yesterday. The problem has been resolved. The bleeding is associated with nothing. He has tried pressure and ice for the symptoms. The treatment provided significant relief.  Hypertension  This is a chronic problem. The problem is uncontrolled (has been out of meds for 2 weeks). Pertinent negatives include no chest pain, palpitations or shortness of breath.      Review of Systems  Constitutional: Negative for chills, fatigue and fever.  HENT: Positive for nosebleeds. Negative for trouble swallowing.   Eyes: Negative for visual disturbance.  Respiratory: Negative for cough, chest tightness, shortness of breath and wheezing.   Cardiovascular: Negative for chest pain and palpitations.  Musculoskeletal: Negative for arthralgias.  Neurological: Positive for light-headedness (several brief transient episodes). Negative for dizziness.    Patient Active Problem List   Diagnosis Date Noted  . Family history of prostate cancer in father 01/19/2017  . Pill dysphagia 11/18/2015  . Erectile dysfunction 11/18/2015  . Hypertension 02/12/2015    Prior to Admission medications   Medication Sig Start Date End Date Taking? Authorizing Provider  amLODipine (NORVASC) 5 MG tablet Take 1 tablet (5 mg total) by mouth daily. 01/19/17   Glean Hess, MD  b complex vitamins capsule Take 1 capsule by mouth daily.    [provider]  cholecalciferol (VITAMIN D)  1000 units tablet Take 1,000 Units by mouth daily.    [provider]  Multiple Vitamin (MULTIVITAMIN) capsule Take 1 capsule by mouth daily.    [provider]  Nettle, Urtica Dioica, (NETTLE LEAF PO) Take by mouth.    [provider]  OLIVE LEAF PO Take by mouth.    [provider]  selenium 50 MCG TABS tablet Take 50 mcg by mouth daily.    [provider]  Turmeric, Lear Ng, (CURCUMIN) POWD by Does not apply route.    [provider]  vitamin E 100 UNIT capsule Take by mouth daily.    [provider]    Allergies  Allergen Reactions  . Penicillins Anaphylaxis    Past Surgical History:  Procedure Laterality Date  . HERNIA REPAIR     age 89    Social History   Tobacco Use  . Smoking status: Never Smoker  . Smokeless tobacco: Never Used  Substance Use Topics  . Alcohol use: No    Alcohol/week: 0.0 oz  . Drug use: No     Medication list has been reviewed and updated.  PHQ 2/9 Scores 01/19/2017  PHQ - 2 Score 0    Physical Exam  Constitutional: He is oriented to person, place, and time. He appears well-developed. No distress.  HENT:  Head: Normocephalic and atraumatic.  Nose: Mucosal edema present. No nose lacerations or nasal septal hematoma. No epistaxis. Right sinus exhibits no maxillary sinus tenderness and no frontal sinus tenderness. Left sinus exhibits no maxillary sinus tenderness and no frontal  sinus tenderness.  Pulmonary/Chest: Effort normal. No respiratory distress.  Musculoskeletal: Normal range of motion.  Neurological: He is alert and oriented to person, place, and time.  Skin: Skin is warm and dry. No rash noted.  Psychiatric: He has a normal mood and affect. His behavior is normal. Thought content normal.    BP (!) 142/98   Pulse 61   Ht 6' (1.829 m)   Wt 231 lb (104.8 kg)   SpO2 95%   BMI 31.33 kg/m   Assessment and Plan: 1. Essential hypertension Resume medication  today Recheck in 2 months - amLODipine (NORVASC) 5 MG tablet; Take 1 tablet (5 mg total) by mouth daily.  Dispense: 90 tablet; Refill: 1  2. Anterior epistaxis Recommend saline spray ED if needed for recurrent bleeding  Meds ordered this encounter  Medications  . amLODipine (NORVASC) 5 MG tablet    Sig: Take 1 tablet (5 mg total) by mouth daily.    Dispense:  90 tablet    Refill:  1    Partially dictated using Editor, commissioning. Any errors are unintentional.  Halina Maidens, MD [facility]  08/09/2017

## 2017-08-09 NOTE — ED Notes (Signed)
Patient was emergently brought from triage to room 25 via wheelchair and staff. Per Dr. Evlyn Courier, patient had a syncopal episode while he was attempting to assess patient. When patient was brought back to 25, patient was lethargic and diaphoretic. Immediately patient was placed on a cardiac monitor, blood pressure monitoring, and oxygen level. Also, IV was established.

## 2017-08-09 NOTE — Patient Instructions (Signed)
Saline nasal spray - use as needed to keep nasal passages moist

## 2017-08-09 NOTE — ED Notes (Signed)
Cleansed hands and arms with warm soapy water to remove dried blood.

## 2017-08-09 NOTE — ED Provider Notes (Signed)
[facility] DEPT Provider Note  CSN: [id number] Arrival date & time: 08/09/17 1334  Chief Complaint(s) Epistaxis  HPI Alexander Davis is a 57 y.o. male   The history is provided by the patient.  Epistaxis   This is a recurrent problem. The current episode started yesterday. The problem occurs daily. Progression since onset: resolved yesterday, but recurred today after PCP follow up. The bleeding has been from both nares. He has tried applying pressure and vasoconstrictors for the symptoms. The treatment provided no relief. His past medical history is significant for allergies. His past medical history does not include bleeding disorder, colds, sinus problems, nose-picking or frequent nosebleeds.    Past Medical History Past Medical History:  Diagnosis Date  . Asthma    As child   . Hypertension    Patient Active Problem List   Diagnosis Date Noted  . Family history of prostate cancer in father 01/19/2017  . Pill dysphagia 11/18/2015  . Erectile dysfunction 11/18/2015  . Hypertension 02/12/2015   Home Medication(s) Prior to Admission medications   Medication Sig Start Date End Date Taking? Authorizing Provider  amLODipine (NORVASC) 5 MG tablet Take 1 tablet (5 mg total) by mouth daily. 08/09/17  Yes Glean Hess, MD  b complex vitamins capsule Take 1 capsule by mouth daily.   Yes [provider]  cholecalciferol (VITAMIN D) 1000 units tablet Take 1,000 Units by mouth daily.   Yes [provider]  Multiple Vitamin (MULTIVITAMIN) capsule Take 1 capsule by mouth daily.   Yes [provider]  Nettle, Urtica Dioica, (NETTLE LEAF PO) Take by mouth.   Yes [provider]  OLIVE LEAF PO Take by mouth.   Yes [provider]  selenium 50 MCG TABS tablet Take 50 mcg by mouth daily.   Yes [provider]  Turmeric, Lear Ng, (CURCUMIN) POWD by Does not apply route.   Yes [provider]  vitamin E 100 UNIT capsule Take 100 Units by mouth daily.    Yes [provider]                                                                                                                                    Past Surgical History Past Surgical History:  Procedure Laterality Date  . HERNIA REPAIR     age 28   Family History Family History  Problem Relation Age of Onset  . Hypertension Father   . Stroke Father   . Prostate cancer Father 75  . Hypertension Sister   . Breast cancer Sister     Social History Social History   Tobacco Use  . Smoking status: Never Smoker  . Smokeless tobacco: Never Used  Substance Use Topics  . Alcohol use: No    Alcohol/week: 0.0 oz  . Drug use: No   Allergies Penicillins  Review of Systems Review of Systems  HENT: Positive  for nosebleeds.    All other systems are reviewed and are negative for acute change except as noted in the HPI  Physical Exam Vital Signs  I have reviewed the triage vital signs BP (!) 144/97   Pulse 64   Resp 16   Ht 6' (1.829 m)   Wt 104.8 kg (231 lb)   SpO2 97%   BMI 31.33 kg/m   Physical Exam  Constitutional: He is oriented to person, place, and time. He appears well-developed and well-nourished. No distress.  HENT:  Head: Normocephalic and atraumatic.  Nose: Nose normal.  Active bleeding from right nostril. Left nare with dried blood in it.  Eyes: Pupils are equal, round, and reactive to light. Conjunctivae and EOM are normal. Right eye exhibits no discharge. Left eye exhibits no discharge. No scleral icterus.  Neck: Normal range of motion. Neck supple.  Cardiovascular: Normal rate and regular rhythm. Exam reveals no gallop and no friction rub.  No murmur heard. Pulmonary/Chest: Effort normal and breath sounds normal. No stridor. No respiratory distress. He has no rales.  Abdominal: Soft. He exhibits no distension. There is no tenderness.  Musculoskeletal: He exhibits no edema or  tenderness.  Neurological: He is alert and oriented to person, place, and time.  Skin: Skin is warm and dry. No rash noted. He is not diaphoretic. No erythema.  Psychiatric: He has a normal mood and affect.  Vitals reviewed.   ED Results and Treatments Labs (all labs ordered are listed, but only abnormal results are displayed) Labs Reviewed  I-STAT CHEM 8, ED - Abnormal; Notable for the following components:      Result Value   Glucose, Bld 107 (*)    Hemoglobin 11.9 (*)    HCT 35.0 (*)    All other components within normal limits                                                                                                                         EKG  EKG Interpretation  Date/Time:    Ventricular Rate:    PR Interval:    QRS Duration:   QT Interval:    QTC Calculation:   R Axis:     Text Interpretation:        Radiology No results found. Pertinent labs & imaging results that were available during my care of the patient were reviewed by me and considered in my medical decision making (see chart for details).  Medications Ordered in ED Medications  sodium chloride 0.9 % bolus 2,000 mL (2,000 mLs Intravenous New Bag/Given 08/09/17 1423)  Procedures Procedures  (including critical care time)  Medical Decision Making / ED Course I have reviewed the nursing notes for this encounter and the patient's prior records (if available in EHR or on provided paperwork).    Epistaxis with active bleeding from right nare. Left nare with dried blood in it.  Prior to more thorough exam, pt syncopized in triage. Taken immediately to acute bed. Regained consciousness en route to bed. Epistaxis resolved spontaneously during syncopal episode. No active bleeding noted on nose exam. No source of bleed. Packing placed bilaterally due to likelihood of rebleed. Hb  reassuring. Provided with IVF bolus.  On reeval, pt feels better. Still getting IVF. No rebleeding. No blood in posterior oropharynx concerning for posterior bleed.   Will continue to monitor until IVF are given and reassess.  Patient care turned over to Dr Regenia Skeeter at 1630. Patient case and results discussed in detail; please see their note for further ED managment.    Final Clinical Impression(s) / ED Diagnoses Final diagnoses:  Epistaxis, recurrent  Vasovagal syncope      This chart was dictated using voice recognition software.  Despite best efforts to proofread,  errors can occur which can change the documentation meaning.   Fatima Blank, MD 08/09/17 [phone]

## 2017-08-09 NOTE — ED Triage Notes (Signed)
Dr. Leonette Monarch notified of patient bleeding.

## 2017-08-09 NOTE — ED Notes (Signed)
Dr. Evlyn Courier at bedside assessing patient.

## 2017-08-09 NOTE — ED Notes (Signed)
Bed: WA25 Expected date:  Expected time:  Means of arrival:  Comments: 

## 2017-08-09 NOTE — Progress Notes (Addendum)
Chaplain responding to page request for support with pt's spouse.  Provided support in ED consult room.   Pt's sister, Jolly Mango, died of cancer at [facility] 3 years prior.  Spouse relates the last time she was at [facility], she was with her sister in law at end of life.  Expresses feelings of surreal, shock, uncertainty.    Chaplain accompanied spouse to bedside and present for support as needed.     [facility] Chaplain Jerene Pitch, MDiv, [facility]

## 2017-08-09 NOTE — ED Notes (Signed)
Patient is alert, speaking with staff. Dr. Evlyn Courier remains at bedside attempting to place a Rhinorocket in right nostril.

## 2017-08-09 NOTE — ED Notes (Signed)
Patients spouse reported he was feeling "funny". Went to bedside, patient is alert, oriented x 4. Lowered his head of the bed and provided a warm blanket. Patient reports he feels a little lightheaded. Informed Dr. Evlyn Courier of patients condition.

## 2017-08-17 ENCOUNTER — Encounter: Payer: BC Managed Care – PPO | Admitting: Internal Medicine

## 2017-10-11 ENCOUNTER — Ambulatory Visit: Payer: BC Managed Care – PPO | Admitting: Internal Medicine

## 2017-10-11 ENCOUNTER — Encounter: Payer: Self-pay | Admitting: Internal Medicine

## 2017-10-11 VITALS — BP 160/100 | HR 60 | Temp 98.1°F | Resp 16 | Ht 72.0 in | Wt 235.0 lb

## 2017-10-11 DIAGNOSIS — I10 Essential (primary) hypertension: Secondary | ICD-10-CM | POA: Diagnosis not present

## 2017-10-11 DIAGNOSIS — Z1211 Encounter for screening for malignant neoplasm of colon: Secondary | ICD-10-CM

## 2017-10-11 MED ORDER — AMLODIPINE BESYLATE 10 MG PO TABS: 10.0000 mg | ORAL_TABLET | Freq: Every day | ORAL | 1 refills | Status: DC

## 2017-10-11 NOTE — Patient Instructions (Signed)
Goal BP is less than 140/90. 

## 2017-10-11 NOTE — Progress Notes (Signed)
Date:  10/11/2017   Name:  Alexander Davis   DOB:  1960/06/17   MRN:  [id number]   Chief Complaint: Hypertension Hypertension  This is a chronic problem. The problem is uncontrolled (had been out of meds last visit). Pertinent negatives include no blurred vision, chest pain, headaches, palpitations, peripheral edema or shortness of breath. There are no associated agents to hypertension. There are no known risk factors for coronary artery disease. Past treatments include calcium channel blockers. The current treatment provides moderate improvement. There are no compliance problems.      Review of Systems  Constitutional: Negative for diaphoresis, fatigue, fever and unexpected weight change.  Eyes: Negative for blurred vision and visual disturbance.  Respiratory: Negative for shortness of breath.   Cardiovascular: Negative for chest pain, palpitations and leg swelling.  Gastrointestinal: Negative for constipation.  Allergic/Immunologic: Negative for environmental allergies.  Neurological: Negative for dizziness and headaches.  Psychiatric/Behavioral: Negative for sleep disturbance.    Patient Active Problem List   Diagnosis Date Noted  . Family history of prostate cancer in father 01/19/2017  . Pill dysphagia 11/18/2015  . Erectile dysfunction 11/18/2015  . Hypertension 02/12/2015    Prior to Admission medications   Medication Sig Start Date End Date Taking? Authorizing Provider  amLODipine (NORVASC) 5 MG tablet Take 1 tablet (5 mg total) by mouth daily. 08/09/17  Yes Glean Hess, MD  b complex vitamins capsule Take 1 capsule by mouth daily.   Yes [provider]  cholecalciferol (VITAMIN D) 1000 units tablet Take 1,000 Units by mouth daily.   Yes [provider]  Multiple Vitamin (MULTIVITAMIN) capsule Take 1 capsule by mouth daily.   Yes [provider]  Nettle, Urtica Dioica, (NETTLE LEAF PO) Take by mouth.   Yes [provider]    OLIVE LEAF PO Take by mouth.   Yes [provider]  selenium 50 MCG TABS tablet Take 50 mcg by mouth daily.   Yes [provider]  vitamin E 100 UNIT capsule Take 100 Units by mouth daily.    Yes [provider]  Turmeric, Lear Ng, (CURCUMIN) POWD by Does not apply route.    [provider]    Allergies  Allergen Reactions  . Penicillins Anaphylaxis    Has patient had a PCN reaction causing immediate rash, facial/tongue/throat swelling, SOB or lightheadedness with hypotension: Unknown Has patient had a PCN reaction causing severe rash involving mucus membranes or skin necrosis: Unknown Has patient had a PCN reaction that required hospitalization: Unknown Has patient had a PCN reaction occurring within the last 10 years: No If all of the above answers are "NO", then may proceed with Cephalosporin use.      Past Surgical History:  Procedure Laterality Date  . HERNIA REPAIR     age 89    Social History   Tobacco Use  . Smoking status: Never Smoker  . Smokeless tobacco: Never Used  Substance Use Topics  . Alcohol use: No    Alcohol/week: 0.0 oz  . Drug use: No     Medication list has been reviewed and updated.  Current Meds  Medication Sig  . amLODipine (NORVASC) 5 MG tablet Take 1 tablet (5 mg total) by mouth daily.  Marland Kitchen b complex vitamins capsule Take 1 capsule by mouth daily.  . cholecalciferol (VITAMIN D) 1000 units tablet Take 1,000 Units by mouth daily.  . Multiple Vitamin (MULTIVITAMIN) capsule Take 1 capsule by mouth daily.  Sallee Provencal,  Urtica Dioica, (NETTLE LEAF PO) Take by mouth.  . OLIVE LEAF PO Take by mouth.  . selenium 50 MCG TABS tablet Take 50 mcg by mouth daily.  . vitamin E 100 UNIT capsule Take 100 Units by mouth daily.     PHQ 2/9 Scores 01/19/2017  PHQ - 2 Score 0    Physical Exam  Constitutional: He is oriented to person, place, and time. He appears well-developed. No distress.  HENT:  Head:  Normocephalic and atraumatic.  Neck: Normal range of motion. Neck supple. Carotid bruit is not present.  Cardiovascular: Normal rate, regular rhythm and normal heart sounds.  Pulmonary/Chest: Effort normal and breath sounds normal. No respiratory distress.  Musculoskeletal: Normal range of motion. He exhibits no edema or tenderness.  Neurological: He is alert and oriented to person, place, and time.  Skin: Skin is warm and dry. No rash noted.  Psychiatric: He has a normal mood and affect. His behavior is normal. Thought content normal.  Nursing note and vitals reviewed.   BP (!) 160/100   Pulse 60   Temp 98.1 F (36.7 C) (Oral)   Resp 16   Ht 6' (1.829 m)   Wt 235 lb (106.6 kg)   SpO2 98%   BMI 31.87 kg/m   Assessment and Plan: 1. Essential hypertension Increase amlodipine to 10 mg - amLODipine (NORVASC) 10 MG tablet; Take 1 tablet (10 mg total) by mouth daily.  Dispense: 90 tablet; Refill: 1  2. Colon cancer screening Done ~5 yrs ago at [facility] ordered this encounter  Medications  . amLODipine (NORVASC) 10 MG tablet    Sig: Take 1 tablet (10 mg total) by mouth daily.    Dispense:  90 tablet    Refill:  1    Partially dictated using Editor, commissioning. Any errors are unintentional.  Halina Maidens, MD [facility]  10/11/2017

## 2017-10-20 ENCOUNTER — Telehealth: Payer: Self-pay

## 2017-10-20 NOTE — Telephone Encounter (Signed)
-----   Message from Glean Hess, MD sent at 10/19/2017  1:41 PM EDT ----- Please call Mr. Gramling an give the below information.  Ask him where he wants to go for his colonoscopy and I will send in a referral. Thanks ----- Message ----- From: Charlynn Court Sent: 10/15/2017   2:22 PM To: Glean Hess, MD  I could not find a colonoscopy in our system. [facility] state the pt was suppose to have a colonoscopy in 2017 and he canceled the appt and office visit ----- Message ----- From: Glean Hess, MD Sent: 10/11/2017   9:51 AM To: Charlynn Court  Please get colonoscopy report from [facility] ~ 5 yrs ago.

## 2017-10-20 NOTE — Telephone Encounter (Signed)
LMOM for patient to call back to discuss GI options.

## 2017-11-19 ENCOUNTER — Telehealth: Payer: Self-pay

## 2017-11-19 NOTE — Telephone Encounter (Signed)
Patient called stating he is having swelling in both ankles. Wants advice.  Called and left VM informed to stay off feet as much as possible, elevated his feet, and cut back on salt in his diet.  Told him if not better by a week then to come in to discuss for OV.

## 2017-12-06 ENCOUNTER — Telehealth: Payer: Self-pay

## 2017-12-06 NOTE — Telephone Encounter (Signed)
Patient called and left VM stating he is still having swelling in his ankles and legs and believes the amlodipine is the cause. He was taking 10 mg everyday which was controlling his BP but causing major swelling. For the last 3 days, he tried cutting the tablets in half and taking 5 mg everyday. It was no longer controlling blood pressure but the swelling went down as well. Wanted to call to discuss changing meds.  Called patient back and informed him on VM he will need to call and schedule an appt to discuss changing medications. Told him the appt is needed for documentation purposes.

## 2018-02-28 ENCOUNTER — Encounter: Payer: BC Managed Care – PPO | Admitting: Internal Medicine

## 2018-06-19 ENCOUNTER — Other Ambulatory Visit: Payer: Self-pay | Admitting: Internal Medicine

## 2018-06-19 DIAGNOSIS — I10 Essential (primary) hypertension: Secondary | ICD-10-CM

## 2018-06-20 NOTE — Telephone Encounter (Signed)
Lm to call back

## 2018-08-18 ENCOUNTER — Telehealth: Payer: Self-pay

## 2018-08-18 NOTE — Telephone Encounter (Signed)
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 Michela Pitcher he had a headache when he came home his first day of work and had some "tightness". Unsure tightness of what. Called patient and left VM asking him to call back.  Awaiting callback.

## 2019-11-10 ENCOUNTER — Other Ambulatory Visit: Payer: Self-pay

## 2019-11-10 ENCOUNTER — Observation Stay (HOSPITAL_COMMUNITY)
Admission: EM | Admit: 2019-11-10 | Discharge: 2019-11-13 | Disposition: A | Discharge status: Home / Self Care | Payer: BC Managed Care – PPO | Attending: Internal Medicine | Admitting: Internal Medicine

## 2019-11-10 ENCOUNTER — Encounter (HOSPITAL_COMMUNITY): Payer: Self-pay

## 2019-11-10 DIAGNOSIS — I160 Hypertensive urgency: Secondary | ICD-10-CM | POA: Diagnosis present

## 2019-11-10 DIAGNOSIS — Z20822 Contact with and (suspected) exposure to covid-19: Secondary | ICD-10-CM | POA: Diagnosis not present

## 2019-11-10 DIAGNOSIS — R42 Dizziness and giddiness: Principal | ICD-10-CM

## 2019-11-10 DIAGNOSIS — I10 Essential (primary) hypertension: Secondary | ICD-10-CM

## 2019-11-10 NOTE — ED Triage Notes (Signed)
Pt c/o dizziness and emesis starting after dinner tonight at approx 1900. Pt denies trauma or injury. Pt denies pain.

## 2019-11-11 ENCOUNTER — Emergency Department (HOSPITAL_COMMUNITY): Payer: BC Managed Care – PPO

## 2019-11-11 ENCOUNTER — Encounter (HOSPITAL_COMMUNITY): Payer: Self-pay | Admitting: Internal Medicine

## 2019-11-11 ENCOUNTER — Observation Stay (HOSPITAL_COMMUNITY): Payer: BC Managed Care – PPO

## 2019-11-11 DIAGNOSIS — I160 Hypertensive urgency: Secondary | ICD-10-CM

## 2019-11-11 DIAGNOSIS — R42 Dizziness and giddiness: Principal | ICD-10-CM

## 2019-11-11 HISTORY — DX: Hypertensive urgency: I16.0

## 2019-11-11 LAB — CBC WITH DIFFERENTIAL/PLATELET
Abs Immature Granulocytes: 0.04 10*3/uL (ref 0.00–0.07)
Basophils Absolute: 0 10*3/uL (ref 0.0–0.1)
Basophils Relative: 0 %
Eosinophils Absolute: 0.1 10*3/uL (ref 0.0–0.5)
Eosinophils Relative: 1 %
HCT: 40.4 % (ref 39.0–52.0)
Hemoglobin: 13.3 g/dL (ref 13.0–17.0)
Immature Granulocytes: 0 %
Lymphocytes Relative: 12 %
Lymphs Abs: 1.4 10*3/uL (ref 0.7–4.0)
MCH: 31.4 pg (ref 26.0–34.0)
MCHC: 32.9 g/dL (ref 30.0–36.0)
MCV: 95.5 fL (ref 80.0–100.0)
Monocytes Absolute: 0.6 10*3/uL (ref 0.1–1.0)
Monocytes Relative: 5 %
Neutro Abs: 10.1 10*3/uL — ABNORMAL HIGH (ref 1.7–7.7)
Neutrophils Relative %: 82 %
Platelets: 168 10*3/uL (ref 150–400)
RBC: 4.23 MIL/uL (ref 4.22–5.81)
RDW: 12.5 % (ref 11.5–15.5)
WBC: 12.2 10*3/uL — ABNORMAL HIGH (ref 4.0–10.5)
nRBC: 0 % (ref 0.0–0.2)

## 2019-11-11 LAB — BASIC METABOLIC PANEL
Anion gap: 13 (ref 5–15)
BUN: 10 mg/dL (ref 6–20)
CO2: 26 mmol/L (ref 22–32)
Calcium: 9.2 mg/dL (ref 8.9–10.3)
Chloride: 103 mmol/L (ref 98–111)
Creatinine, Ser: 0.86 mg/dL (ref 0.61–1.24)
GFR calc Af Amer: >60 mL/min (ref >60)
GFR calc non Af Amer: >60 mL/min (ref >60)
Glucose, Bld: 157 mg/dL — ABNORMAL HIGH (ref 70–99)
Potassium: 3.9 mmol/L (ref 3.5–5.1)
Sodium: 142 mmol/L (ref 135–145)

## 2019-11-11 LAB — URINALYSIS, ROUTINE W REFLEX MICROSCOPIC
Bacteria, UA: NONE SEEN
Bilirubin Urine: NEGATIVE
Glucose, UA: NEGATIVE mg/dL
Hgb urine dipstick: NEGATIVE
Ketones, ur: NEGATIVE mg/dL
Leukocytes,Ua: NEGATIVE
Nitrite: NEGATIVE
Protein, ur: NEGATIVE mg/dL
Specific Gravity, Urine: 1.012 (ref 1.005–1.030)
pH: 8 (ref 5.0–8.0)

## 2019-11-11 LAB — SARS CORONAVIRUS 2 BY RT PCR (HOSPITAL ORDER, PERFORMED IN ~~LOC~~ HOSPITAL LAB): SARS Coronavirus 2: NEGATIVE

## 2019-11-11 MED ORDER — DIAZEPAM 5 MG/ML IJ SOLN
5.0000 mg | Freq: Once | INTRAMUSCULAR | Status: AC
  Administered 2019-11-11: 5 mg via INTRAVENOUS
  Filled 2019-11-11: qty 2

## 2019-11-11 MED ORDER — MECLIZINE HCL 25 MG PO TABS
25.0000 mg | ORAL_TABLET | Freq: Once | ORAL | Status: AC
  Administered 2019-11-11: 25 mg via ORAL
  Filled 2019-11-11: qty 1

## 2019-11-11 MED ORDER — ONDANSETRON HCL 4 MG/2ML IJ SOLN: 4.0000 mg | Freq: Four times a day (QID) | INTRAMUSCULAR | Status: DC | PRN

## 2019-11-11 MED ORDER — ENOXAPARIN SODIUM 40 MG/0.4ML ~~LOC~~ SOLN
40.0000 mg | SUBCUTANEOUS | Status: DC
  Administered 2019-11-11: 40 mg via SUBCUTANEOUS
  Filled 2019-11-11: qty 0.4

## 2019-11-11 MED ORDER — SODIUM CHLORIDE 0.9 % IV BOLUS
500.0000 mL | Freq: Once | INTRAVENOUS | Status: AC
  Administered 2019-11-11: 500 mL via INTRAVENOUS

## 2019-11-11 MED ORDER — HYDRALAZINE HCL 20 MG/ML IJ SOLN: 10.0000 mg | INTRAMUSCULAR | Status: DC | PRN

## 2019-11-11 MED ORDER — MECLIZINE HCL 25 MG PO TABS
25.0000 mg | ORAL_TABLET | Freq: Three times a day (TID) | ORAL | Status: DC | PRN
  Administered 2019-11-12: 25 mg via ORAL
  Filled 2019-11-11: qty 1

## 2019-11-11 MED ORDER — AMLODIPINE BESYLATE 10 MG PO TABS
10.0000 mg | ORAL_TABLET | Freq: Every day | ORAL | Status: DC
  Administered 2019-11-11 – 2019-11-13 (×3): 10 mg via ORAL
  Filled 2019-11-11: qty 1
  Filled 2019-11-11: qty 2
  Filled 2019-11-11: qty 1

## 2019-11-11 MED ORDER — ONDANSETRON HCL 4 MG PO TABS
4.0000 mg | ORAL_TABLET | Freq: Four times a day (QID) | ORAL | Status: DC | PRN
  Administered 2019-11-11: 4 mg via ORAL
  Filled 2019-11-11: qty 1

## 2019-11-11 MED ORDER — LORAZEPAM 2 MG/ML IJ SOLN
1.0000 mg | Freq: Once | INTRAMUSCULAR | Status: AC
  Administered 2019-11-11: 1 mg via INTRAVENOUS
  Filled 2019-11-11: qty 1

## 2019-11-11 MED ORDER — HYDRALAZINE HCL 20 MG/ML IJ SOLN
10.0000 mg | INTRAMUSCULAR | Status: DC | PRN
  Administered 2019-11-12: 10 mg via INTRAVENOUS
  Filled 2019-11-11: qty 1

## 2019-11-11 MED ORDER — HYDROCHLOROTHIAZIDE 25 MG PO TABS
25.0000 mg | ORAL_TABLET | Freq: Every day | ORAL | Status: DC
  Administered 2019-11-11 – 2019-11-13 (×3): 25 mg via ORAL
  Filled 2019-11-11 (×3): qty 1

## 2019-11-11 MED ORDER — ONDANSETRON HCL 4 MG/2ML IJ SOLN
4.0000 mg | Freq: Once | INTRAMUSCULAR | Status: AC
  Administered 2019-11-11: 4 mg via INTRAVENOUS
  Filled 2019-11-11: qty 2

## 2019-11-11 NOTE — ED Provider Notes (Signed)
[facility] DEPT Provider Note   CSN: [id number] Arrival date & time: 11/10/19  2144     History Chief Complaint  Patient presents with  . Dizziness  . Emesis    Alexander Davis is a 59 y.o. male.  Patient to ED for evaluation of nausea and 'room-spinning' type dizziness that started yesterday afternoon around 5:00. No falls. He denies headache, weakness, fever.  He has had multiple episodes of vomiting that has been NBNB. No history of vertigo. He reports a history of HTN and admits to poor compliance with medications.   The history is provided by the patient and the spouse. No language interpreter was used.       Past Medical History:  Diagnosis Date  . Asthma    As child   . Hypertension     Patient Active Problem List   Diagnosis Date Noted  . Family history of prostate cancer in father 01/19/2017  . Pill dysphagia 11/18/2015  . Erectile dysfunction 11/18/2015  . Hypertension 02/12/2015    Past Surgical History:  Procedure Laterality Date  . HERNIA REPAIR     age 66       Family History  Problem Relation Age of Onset  . Hypertension Father   . Stroke Father   . Prostate cancer Father 38  . Hypertension Sister   . Breast cancer Sister     Social History   Tobacco Use  . Smoking status: Never Smoker  . Smokeless tobacco: Never Used  Vaping Use  . Vaping Use: Never used  Substance Use Topics  . Alcohol use: No    Alcohol/week: 0.0 standard drinks  . Drug use: No    Home Medications Prior to Admission medications   Medication Sig Start Date End Date Taking? Authorizing Provider  amLODipine (NORVASC) 10 MG tablet Take 1 tablet (10 mg total) by mouth daily. 10/11/17   Glean Hess, MD  amLODipine (NORVASC) 10 MG tablet Take 1 tablet (10 mg total) by mouth daily. 06/19/18   Glean Hess, MD  b complex vitamins capsule Take 1 capsule by mouth daily.    [provider]  cholecalciferol (VITAMIN  D) 1000 units tablet Take 1,000 Units by mouth daily.    [provider]  Multiple Vitamin (MULTIVITAMIN) capsule Take 1 capsule by mouth daily.    [provider]  Nettle, Urtica Dioica, (NETTLE LEAF PO) Take by mouth.    [provider]  OLIVE LEAF PO Take by mouth.    [provider]  selenium 50 MCG TABS tablet Take 50 mcg by mouth daily.    [provider]  Turmeric, Lear Ng, (CURCUMIN) POWD by Does not apply route.    [provider]  vitamin E 100 UNIT capsule Take 100 Units by mouth daily.     [provider]    Allergies    Penicillins  Review of Systems   Review of Systems  Constitutional: Negative for chills and fever.  HENT: Negative.   Eyes: Negative for visual disturbance.  Respiratory: Negative.  Negative for shortness of breath.   Cardiovascular: Negative.  Negative for chest pain.  Gastrointestinal: Positive for nausea and vomiting. Negative for abdominal pain.  Musculoskeletal: Negative.   Skin: Negative.   Neurological: Positive for dizziness. Negative for syncope, facial asymmetry, speech difficulty, weakness and headaches.    Physical Exam Updated Vital Signs BP (!) 166/94   Pulse 71   Temp 97.6 F (36.4 C) (Oral)  Resp 18   Ht 6' (1.829 m)   Wt 106.6 kg   SpO2 96%   BMI 31.87 kg/m   Physical Exam Constitutional:      Appearance: He is well-developed.  HENT:     Head: Normocephalic.  Cardiovascular:     Rate and Rhythm: Normal rate and regular rhythm.  Pulmonary:     Effort: Pulmonary effort is normal.     Breath sounds: Normal breath sounds.  Abdominal:     General: Bowel sounds are normal.     Palpations: Abdomen is soft.     Tenderness: There is no abdominal tenderness. There is no guarding or rebound.  Musculoskeletal:        General: Normal range of motion.     Cervical back: Normal range of motion and neck supple.  Skin:    General: Skin is warm and dry.      Findings: No rash.  Neurological:     Mental Status: He is alert and oriented to person, place, and time.     GCS: GCS eye subscore is 4. GCS verbal subscore is 5. GCS motor subscore is 6.     Cranial Nerves: No dysarthria or facial asymmetry.     Sensory: No sensory deficit.     Motor: No weakness.     Coordination: Coordination normal.     Deep Tendon Reflexes:     Reflex Scores:      Patellar reflexes are 2+ on the right side and 2+ on the left side.    Comments: CN's 3-12 grossly intact.  Lateral nystagmus present.      ED Results / Procedures / Treatments   Labs (all labs ordered are listed, but only abnormal results are displayed) Labs Reviewed  CBC WITH DIFFERENTIAL/PLATELET - Abnormal; Notable for the following components:      Result Value   WBC 12.2 (*)    Neutro Abs 10.1 (*)    All other components within normal limits  BASIC METABOLIC PANEL - Abnormal; Notable for the following components:   Glucose, Bld 157 (*)    All other components within normal limits  URINALYSIS, ROUTINE W REFLEX MICROSCOPIC   Results for orders placed or performed during the hospital encounter of 11/10/19  CBC with Differential  Result Value Ref Range   WBC 12.2 (H) 4.0 - 10.5 K/uL   RBC 4.23 4.22 - 5.81 MIL/uL   Hemoglobin 13.3 13.0 - 17.0 g/dL   HCT 40.4 39 - 52 %   MCV 95.5 80.0 - 100.0 fL   MCH 31.4 26.0 - 34.0 pg   MCHC 32.9 30.0 - 36.0 g/dL   RDW 12.5 11.5 - 15.5 %   Platelets 168 150 - 400 K/uL   nRBC 0.0 0.0 - 0.2 %   Neutrophils Relative % 82 %   Neutro Abs 10.1 (H) 1.7 - 7.7 K/uL   Lymphocytes Relative 12 %   Lymphs Abs 1.4 0.7 - 4.0 K/uL   Monocytes Relative 5 %   Monocytes Absolute 0.6 0 - 1 K/uL   Eosinophils Relative 1 %   Eosinophils Absolute 0.1 0 - 0 K/uL   Basophils Relative 0 %   Basophils Absolute 0.0 0 - 0 K/uL   Immature Granulocytes 0 %   Abs Immature Granulocytes 0.04 0.00 - 0.07 K/uL  Basic metabolic panel  Result Value Ref Range   Sodium 142 135 -  145 mmol/L   Potassium 3.9 3.5 - 5.1 mmol/L   Chloride 103 98 -  111 mmol/L   CO2 26 22 - 32 mmol/L   Glucose, Bld 157 (H) 70 - 99 mg/dL   BUN 10 6 - 20 mg/dL   Creatinine, Ser 0.86 0.61 - 1.24 mg/dL   Calcium 9.2 8.9 - 10.3 mg/dL   GFR calc non Af Amer >60 >60 mL/min   GFR calc Af Amer >60 >60 mL/min   Anion gap 13 5 - 15  Urinalysis, Routine w reflex microscopic  Result Value Ref Range   Color, Urine YELLOW YELLOW   APPearance HAZY (A) CLEAR   Specific Gravity, Urine 1.012 1.005 - 1.030   pH 8.0 5.0 - 8.0   Glucose, UA NEGATIVE NEGATIVE mg/dL   Hgb urine dipstick NEGATIVE NEGATIVE   Bilirubin Urine NEGATIVE NEGATIVE   Ketones, ur NEGATIVE NEGATIVE mg/dL   Protein, ur NEGATIVE NEGATIVE mg/dL   Nitrite NEGATIVE NEGATIVE   Leukocytes,Ua NEGATIVE NEGATIVE   RBC / HPF 0-5 0 - 5 RBC/hpf   WBC, UA 0-5 0 - 5 WBC/hpf   Bacteria, UA NONE SEEN NONE SEEN   Mucus PRESENT     EKG EKG Interpretation  Date/Time:  Friday November 10 2019 23:59:35 EDT Ventricular Rate:  61 PR Interval:    QRS Duration: 92 QT Interval:  468 QTC Calculation: 472 R Axis:   31 Text Interpretation: Sinus rhythm Borderline repolarization abnormality No old tracing to compare Confirmed by Pryor Curia [phone]) on 11/11/2019 12:04:50 AM   Radiology CT Head Wo Contrast  Result Date: 11/11/2019 CLINICAL DATA:  Dizziness and vomiting. EXAM: CT HEAD WITHOUT CONTRAST TECHNIQUE: Contiguous axial images were obtained from the base of the skull through the vertex without intravenous contrast. COMPARISON:  None. FINDINGS: Brain: No evidence of acute infarction, hemorrhage, hydrocephalus, extra-axial collection or mass lesion/mass effect. Very mild areas of chronic appearing white matter low attenuation are seen within the frontal lobes, bilaterally. Vascular: No hyperdense vessel or unexpected calcification. Skull: Normal. Negative for fracture or focal lesion. Sinuses/Orbits: No acute finding. Other: None. IMPRESSION: 1. No  acute intracranial abnormality. 2. Very mild bilateral frontal lobe chronic appearing white matter disease. Electronically Signed   By: Virgina Norfolk M.D.   On: 11/11/2019 01:14    Procedures Procedures (including critical care time)  Medications Ordered in ED Medications  sodium chloride 0.9 % bolus 500 mL (500 mLs Intravenous New Bag/Given 11/11/19 0140)  diazepam (VALIUM) injection 5 mg (5 mg Intravenous Given 11/11/19 0140)    ED Course  I have reviewed the triage vital signs and the nursing notes.  Pertinent labs & imaging results that were available during my care of the patient were reviewed by me and considered in my medical decision making (see chart for details).    MDM Rules/Calculators/A&P                          Patient to ED with ss/sxs as per HPI.   No neurologic deficits on exam - no lateralizing weakness, facial asymmetry, speech difficulty, coordination deficits. Head CT negative. VSS, mild hypertension. EKG unremarkable.   IV established and Valium and Zofran provided. He reports improvement in nausea but dizziness persists. During ED encounter he received Valium, Ativan and Meclizine for symptomatic relief. On serial exams and multiple attempts, the patient becomes symptomatic when sitting up. Ambulation is not attempted.   The patient will need to be admitted for further treatment prior to safe discharge home. Patient and spouse agree with plan. Hospitalist paged for admission.  Final Clinical Impression(s) / ED Diagnoses Final diagnoses:  None   1. Vertigo  Rx / DC Orders ED Discharge Orders    None       Charlann Lange, Hershal Coria 11/11/19 [place], Delice Bison, DO 11/11/19 [phone]

## 2019-11-11 NOTE — H&P (Addendum)
History and Physical    Alexander Davis DQQ:[id number] DOB: 1961-02-15 DOA: 11/10/2019  PCP: Glean Hess, MD  Patient coming from: Home.  Chief Complaint: Dizziness.  HPI: Alexander Davis is a 59 y.o. male with history of hypertension presents to the ER with complaint of dizziness which started off phentermine since last evening around 9 PM at home.  Had had previous couple of episodes of vomiting.  Denies any headache visual symptoms or any weakness of upper or lower extremity.  Symptoms are largely on trying to move.  ED Course: In the ER patient was found to be nonfocal.  CT head is unremarkable EKG shows normal sinus rhythm.  Was given Antivert and Ativan despite which patient was still dizzy admitted for further management.  MRI brain is pending.  Review of Systems: As per HPI, rest all negative.   Past Medical History:  Diagnosis Date  . Asthma    As child   . Hypertension     Past Surgical History:  Procedure Laterality Date  . HERNIA REPAIR     age 20     reports that he has never smoked. He has never used smokeless tobacco. He reports that he does not drink alcohol and does not use drugs.  Allergies  Allergen Reactions  . Penicillins Anaphylaxis    Has patient had a PCN reaction causing immediate rash, facial/tongue/throat swelling, SOB or lightheadedness with hypotension: Unknown Has patient had a PCN reaction causing severe rash involving mucus membranes or skin necrosis: Unknown Has patient had a PCN reaction that required hospitalization: Unknown Has patient had a PCN reaction occurring within the last 10 years: No If all of the above answers are "NO", then may proceed with Cephalosporin use.      Family History  Problem Relation Age of Onset  . Hypertension Father   . Stroke Father   . Prostate cancer Father 56  . Hypertension Sister   . Breast cancer Sister     Prior to Admission medications   Medication Sig Start Date End Date  Taking? Authorizing Provider  amLODipine (NORVASC) 10 MG tablet Take 1 tablet (10 mg total) by mouth daily. 10/11/17   Glean Hess, MD  amLODipine (NORVASC) 10 MG tablet Take 1 tablet (10 mg total) by mouth daily. 06/19/18   Glean Hess, MD  b complex vitamins capsule Take 1 capsule by mouth daily.    [provider]  cholecalciferol (VITAMIN D) 1000 units tablet Take 1,000 Units by mouth daily.    [provider]  Multiple Vitamin (MULTIVITAMIN) capsule Take 1 capsule by mouth daily.    [provider]  Nettle, Urtica Dioica, (NETTLE LEAF PO) Take by mouth.    [provider]  OLIVE LEAF PO Take by mouth.    [provider]  selenium 50 MCG TABS tablet Take 50 mcg by mouth daily.    [provider]  Turmeric, Lear Ng, (CURCUMIN) POWD by Does not apply route.    [provider]  vitamin E 100 UNIT capsule Take 100 Units by mouth daily.     [provider]    Physical Exam: Constitutional: Moderately built and nourished. Vitals:   11/11/19 0430 11/11/19 0500 11/11/19 0600 11/11/19 0630  BP: (!) 167/105 (!) 161/104 (!) 170/101 (!) 170/102  Pulse:  68 68 68  Resp: 20 18 19 19   Temp:      TempSrc:      SpO2:  96% 96% 97%  Weight:      Height:       Eyes: Anicteric no pallor. ENMT: No discharge from the ears eyes nose or mouth. Neck: No mass felt.  No neck rigidity. Respiratory: No rhonchi or crepitations. Cardiovascular: S1-S2 heard. Abdomen: Soft nontender bowel sounds present. Musculoskeletal: No edema. Skin: No rash. Neurologic: Alert awake oriented time place and person.  Moves all extremities 5 x 5.  Horizontal nystagmus present.  No facial asymmetry tongue is midline. Psychiatric: Appears normal but normal affect.   Labs on Admission: I have personally reviewed following labs and imaging studies  CBC: Recent Labs  Lab 11/10/19 2346  WBC 12.2*  NEUTROABS 10.1*  HGB 13.3  HCT 40.4    MCV 95.5  PLT 465   Basic Metabolic Panel: Recent Labs  Lab 11/10/19 2346  NA 142  K 3.9  CL 103  CO2 26  GLUCOSE 157*  BUN 10  CREATININE 0.86  CALCIUM 9.2   GFR: Estimated Creatinine Clearance: 119.6 mL/min (by C-G formula based on SCr of 0.86 mg/dL). Liver Function Tests: No results for input(s): AST, ALT, ALKPHOS, BILITOT, PROT, ALBUMIN in the last 168 hours. No results for input(s): LIPASE, AMYLASE in the last 168 hours. No results for input(s): AMMONIA in the last 168 hours. Coagulation Profile: No results for input(s): INR, PROTIME in the last 168 hours. Cardiac Enzymes: No results for input(s): CKTOTAL, CKMB, CKMBINDEX, TROPONINI in the last 168 hours. BNP (last 3 results) No results for input(s): PROBNP in the last 8760 hours. HbA1C: No results for input(s): HGBA1C in the last 72 hours. CBG: No results for input(s): GLUCAP in the last 168 hours. Lipid Profile: No results for input(s): CHOL, HDL, LDLCALC, TRIG, CHOLHDL, LDLDIRECT in the last 72 hours. Thyroid Function Tests: No results for input(s): TSH, T4TOTAL, FREET4, T3FREE, THYROIDAB in the last 72 hours. Anemia Panel: No results for input(s): VITAMINB12, FOLATE, FERRITIN, TIBC, IRON, RETICCTPCT in the last 72 hours. Urine analysis:    Component Value Date/Time   COLORURINE YELLOW 11/10/2019 2353   APPEARANCEUR HAZY (A) 11/10/2019 2353   LABSPEC 1.012 11/10/2019 2353   PHURINE 8.0 11/10/2019 2353   GLUCOSEU NEGATIVE 11/10/2019 2353   HGBUR NEGATIVE 11/10/2019 2353   BILIRUBINUR NEGATIVE 11/10/2019 2353   KETONESUR NEGATIVE 11/10/2019 2353   PROTEINUR NEGATIVE 11/10/2019 2353   NITRITE NEGATIVE 11/10/2019 2353   LEUKOCYTESUR NEGATIVE 11/10/2019 2353   Sepsis Labs: @LABRCNTIP (procalcitonin:4,lacticidven:4) )No results found for this or any previous visit (from the past 240 hour(s)).   Radiological Exams on Admission: CT Head Wo Contrast  Result Date: 11/11/2019 CLINICAL DATA:  Dizziness and  vomiting. EXAM: CT HEAD WITHOUT CONTRAST TECHNIQUE: Contiguous axial images were obtained from the base of the skull through the vertex without intravenous contrast. COMPARISON:  None. FINDINGS: Brain: No evidence of acute infarction, hemorrhage, hydrocephalus, extra-axial collection or mass lesion/mass effect. Very mild areas of chronic appearing white matter low attenuation are seen within the frontal lobes, bilaterally. Vascular: No hyperdense vessel or unexpected calcification. Skull: Normal. Negative for fracture or focal lesion. Sinuses/Orbits: No acute finding. Other: None. IMPRESSION: 1. No acute intracranial abnormality. 2. Very mild bilateral frontal lobe chronic appearing white matter disease. Electronically Signed   By: Virgina Norfolk M.D.   On: 11/11/2019 01:14    EKG: Independently reviewed.  Normal sinus rhythm early repolarization changes.  Assessment/Plan Principal Problem:   Vertigo Active Problems:   Hypertensive urgency    1. Dizziness likely from benign positional vertigo.  Will check MRI  brain to rule out stroke.  Get physical therapy consult. 2. Hypertensive urgency -patient wife states that patient has been noncompliant with his amlodipine.  Continue amlodipine and allow for permissive hypertension until stroke ruled out.  As needed IV hydralazine for now for systolic more than 553 and diastolic more than 748.  If stroke was ruled out more aggressive management.  Covid test is pending.   DVT prophylaxis: Lovenox. Code Status: Full code. Family Communication: Discussed with patient's wife. Disposition Plan: Home. Consults called: Physical therapy. Admission status: Observation.   Rise Patience MD [facility] Pager [phone].  If 7PM-7AM, please contact night-coverage [email or web address] Password [facility]  11/11/2019, 6:58 AM

## 2019-11-11 NOTE — Progress Notes (Signed)
Patient seen and examined personally, I reviewed the chart, history and physical and admission note, done by admitting physician this morning and agree with the same with following addendum.  Please refer to the morning admission note for more detailed plan of care.  Briefly,  59 year old male with hypertension asthma child presented to the ED with dizziness which started off phentermine since last evening around 9 PM.  He had previous couple episodes of vomiting.  Denied any weakness headache.  Symptoms are more often when he moves. In the ED CT head unremarkable EKG normal sinus rhythm was given Antivert and Ativan but dizziness persisted and was admitted for further.  He feels some better not as dizzy/lightheaded/vertigo. Non focal on exam.No headache Had nausea or vomiting until this am.  Issues: Dizziness: MRI pending to rule out stroke, suspecting BPPV, PT OT as planned. Hypertensive urgency, noncompliant with a,lodipine- pt reports he was taking may be once a week. Tool last night. BP poorly controlled. MRI reviewed: "Negative for acute infarct. Mild chronic microvascular ischemic change in the white matter. Multiple areas of chronic microhemorrhage in the brain. Correlate with hypertension history." Suspect due to poorly controlled hypertension noncompliance.  Amlodipine resumed, continue hydralazine iv for SBP >170. He reports lisinopril caused loss of libido and he is worried abt BP meds. We discussed. Continue to monitor blood pressure closely,if remains poorly controlled will add additional agent- start hctz today evening as BP currently at 187/96.  Remains hospitalized due to poorly controlled htn and vertigo

## 2019-11-11 NOTE — ED Notes (Signed)
Pt in bed with wife at bedside. C/O noises being too loud and lights too bright. RN gave pt cool washcloth for his eyes and head.

## 2019-11-12 DIAGNOSIS — R42 Dizziness and giddiness: Secondary | ICD-10-CM | POA: Diagnosis not present

## 2019-11-12 LAB — BASIC METABOLIC PANEL
Anion gap: 12 (ref 5–15)
BUN: 11 mg/dL (ref 6–20)
CO2: 26 mmol/L (ref 22–32)
Calcium: 8.9 mg/dL (ref 8.9–10.3)
Chloride: 104 mmol/L (ref 98–111)
Creatinine, Ser: 1.13 mg/dL (ref 0.61–1.24)
GFR calc Af Amer: >60 mL/min (ref >60)
GFR calc non Af Amer: >60 mL/min (ref >60)
Glucose, Bld: 98 mg/dL (ref 70–99)
Potassium: 3.4 mmol/L — ABNORMAL LOW (ref 3.5–5.1)
Sodium: 142 mmol/L (ref 135–145)

## 2019-11-12 LAB — CBC
HCT: 37.9 % — ABNORMAL LOW (ref 39.0–52.0)
Hemoglobin: 12.5 g/dL — ABNORMAL LOW (ref 13.0–17.0)
MCH: 31.1 pg (ref 26.0–34.0)
MCHC: 33 g/dL (ref 30.0–36.0)
MCV: 94.3 fL (ref 80.0–100.0)
Platelets: 162 10*3/uL (ref 150–400)
RBC: 4.02 MIL/uL — ABNORMAL LOW (ref 4.22–5.81)
RDW: 12.5 % (ref 11.5–15.5)
WBC: 6.7 10*3/uL (ref 4.0–10.5)
nRBC: 0 % (ref 0.0–0.2)

## 2019-11-12 LAB — HIV ANTIBODY (ROUTINE TESTING W REFLEX): HIV Screen 4th Generation wRfx: NONREACTIVE

## 2019-11-12 MED ORDER — METOPROLOL TARTRATE 5 MG/5ML IV SOLN
5.0000 mg | INTRAVENOUS | Status: DC | PRN
  Administered 2019-11-12: 5 mg via INTRAVENOUS
  Filled 2019-11-12: qty 5

## 2019-11-12 MED ORDER — ACETAMINOPHEN 325 MG PO TABS
650.0000 mg | ORAL_TABLET | Freq: Four times a day (QID) | ORAL | Status: DC | PRN
  Administered 2019-11-12 – 2019-11-13 (×2): 650 mg via ORAL
  Filled 2019-11-12 (×2): qty 2

## 2019-11-12 MED ORDER — POTASSIUM CHLORIDE CRYS ER 20 MEQ PO TBCR
40.0000 meq | EXTENDED_RELEASE_TABLET | Freq: Once | ORAL | Status: AC
  Administered 2019-11-12: 40 meq via ORAL
  Filled 2019-11-12: qty 2

## 2019-11-12 MED ORDER — HYDRALAZINE HCL 25 MG PO TABS
25.0000 mg | ORAL_TABLET | Freq: Three times a day (TID) | ORAL | Status: DC
  Administered 2019-11-12 – 2019-11-13 (×3): 25 mg via ORAL
  Filled 2019-11-12 (×3): qty 1

## 2019-11-12 NOTE — Evaluation (Signed)
Physical Therapy Evaluation Patient Details Name: Alexander Davis MRN: [id number] DOB: [birth year] Today's Date: 11/12/2019   History of Present Illness  59 yo admitted with dizziness with MRI negative. PMhx: HTN, asthma  Clinical Impression  Pt supine on arrival with lights off stating dizziness started 7/16 sitting in chair after dinner with unsteady gait and vomiting. Pt states no rapid movement, positional changes, travel or other incident preceded onset of symptoms. Pt reports that he is dizzy with ocular movement as well as positional changes with noted nystagmus during looking in right and upper fields but not left of lower. Performed Marye Round with 3/10 dizziness toward left with nystagmus of right rotational lasting >63min. No nystagmus when performed to right and performed Epley maneuver with pt initially stating no spinning upon sitting but when looking to right onset of symptoms returned. Pt educated for vestibular component but more complex then just posterior BPPV. Pt educated for gaze fixation with gait which helped some but did not resolve symptoms. Pt and wife educated for need for further assessment and tx. Pt with meclazine 1 dose on 7/17 with pt stating significant change in symptoms with encouragement to ask for repeat dose.   Pt with decreased mobility, transfers and function limited by spinning sensation and will benefit from further therapy to provide vestibular therapy and training to decrease burden of care.     Follow Up Recommendations Outpatient PT (vestibular)    Equipment Recommendations  None recommended by PT    Recommendations for Other Services       Precautions / Restrictions Precautions Precautions: Fall      Mobility  Bed Mobility Overal bed mobility: Independent                Transfers Overall transfer level: Needs assistance   Transfers: Sit to/from Stand Sit to Stand: Min guard            Ambulation/Gait Ambulation/Gait  assistance: Min assist Gait Distance (Feet): 80 Feet Assistive device: None Gait Pattern/deviations: Step-through pattern;Decreased stride length   Gait velocity interpretation: >2.62 ft/sec, indicative of community ambulatory General Gait Details: pt with slow cautious movement with right lean and one episode of LOB with assist to correct. Pt educated for and performing gaze fixation with gait  Stairs            Wheelchair Mobility    Modified Rankin (Stroke Patients Only)       Balance Overall balance assessment: Needs assistance   Sitting balance-Leahy Scale: Good       Standing balance-Leahy Scale: Good                               Pertinent Vitals/Pain Pain Assessment: No/denies pain    Home Living Family/patient expects to be discharged to:: Private residence Living Arrangements: Spouse/significant other Available Help at Discharge: Family;Available 24 hours/day Type of Home: House Home Access: Stairs to enter   [employer] of Steps: 6 Home Layout: Two level;Bed/bath upstairs Home Equipment: None      Prior Function Level of Independence: Independent               Hand Dominance        Extremity/Trunk Assessment   Upper Extremity Assessment Upper Extremity Assessment: Overall WFL for tasks assessed    Lower Extremity Assessment Lower Extremity Assessment: Overall WFL for tasks assessed    Cervical / Trunk Assessment Cervical / Trunk Assessment: Normal  Communication   Communication: No difficulties  Cognition Arousal/Alertness: Awake/alert Behavior During Therapy: WFL for tasks assessed/performed Overall Cognitive Status: Within Functional Limits for tasks assessed                                        General Comments      Exercises     Assessment/Plan    PT Assessment Patient needs continued PT services  PT Problem List Decreased activity tolerance;Decreased balance;Decreased  knowledge of use of DME;Decreased mobility       PT Treatment Interventions DME instruction;Gait training;Balance training;Functional mobility training;Therapeutic activities;Patient/family education;Other (comment) (vestibular therapy)    PT Goals (Current goals can be found in the Care Plan section)  Acute Rehab PT Goals Patient Stated Goal: walk without dizziness PT Goal Formulation: With patient/family Time For Goal Achievement: 11/26/19 Potential to Achieve Goals: Good    Frequency Min 4X/week   Barriers to discharge        Co-evaluation               AM-PAC PT "6 Clicks" Mobility  Outcome Measure Help needed turning from your back to your side while in a flat bed without using bedrails?: A Little Help needed moving from lying on your back to sitting on the side of a flat bed without using bedrails?: A Little Help needed moving to and from a bed to a chair (including a wheelchair)?: A Little Help needed standing up from a chair using your arms (e.g., wheelchair or bedside chair)?: A Little Help needed to walk in hospital room?: A Little Help needed climbing 3-5 steps with a railing? : A Little 6 Click Score: 18    End of Session Equipment Utilized During Treatment: Gait belt Activity Tolerance: Patient tolerated treatment well Patient left: in chair;with call bell/phone within reach;with family/visitor present Nurse Communication: Mobility status PT Visit Diagnosis: Other abnormalities of gait and mobility (R26.89);Dizziness and giddiness (R42)    Time: 7530-0511 PT Time Calculation (min) (ACUTE ONLY): 38 min   Charges:   PT Evaluation $PT Eval Moderate Complexity: 1 Mod PT Treatments $Therapeutic Activity: 8-22 mins $Canalith Rep Proc: 8-22 mins        Bayard Males, PT Acute Rehabilitation Services Pager: [phone] Office: [phone]   Nigel Ericsson B Kikuye Korenek 11/12/2019, 1:00 PM

## 2019-11-12 NOTE — Progress Notes (Signed)
PROGRESS NOTE    Alexander Davis  FFM:[id number] DOB: 1960/05/04 DOA: 11/10/2019 PCP: Glean Hess, MD   Chief Complaint  Patient presents with  . Dizziness  . Emesis   Brief Narrative: 59 year old male with hypertension asthma child presented to the ED with dizziness which started off phentermine since last evening around 9 PM.  He had previous couple episodes of vomiting.  Denied any weakness headache.  Symptoms are more often when he moves. In the ED CT head unremarkable EKG normal sinus rhythm was given Antivert and Ativan but dizziness persisted and was admitted for further maangement.  Subjective:  Patient reports overall he feels better but he still is lightheaded/dizzy more on the right side. Able to walk with PT today.  Reports it helped some after the Epley's maneuver    Assessment & Plan:  Dizziness/lightheadedness/vertigo: MRI negative stroke but shows mild chronic microvascular ischemic changes in the white matter and multiple areas of chronic microhemorrhages in the brain likely from uncontrolled hypertension.  Patient remains symptomatic, continue meclizine cannot optimize blood pressure medication.  Orthostatic vital signs are negative.  Overall he feels that he is improving.  Hypertension urgency, poorly controlled blood pressure noncompliant with amlodipine taking only sporadically.  Continue amlodipine 10 mg, added HCTZ 25 mg.  Reports lisinopril cause loss of libido and reluctant to be on antihypertensives.However after discussion he agreeable to try the new regimen. We discussed risks/benefits/alternatives.  Add Hydralazine 25mg  TID.  Mild hypokalemia add potassium chloride especially while he is on HCTZ  Leucocytosis:unclear etiology.Resolved.  DVT prophylaxis: Place and maintain sequential compression device Start: 11/11/19 1611 Code Status:   Code Status: Full Code  Family Communication: plan of care discussed with patient at bedside.  Status is:  admitted as Observation Patient remains hospitalized due to symptomatic dizziness/vertigo and poorly controlled blood pressure.  Dispo: The patient is from: Home              Anticipated d/c is to: Home              Anticipated d/c date is: 1 day              Patient currently is not medically stable to d/c.   Nutrition: Diet Order            Diet vegetarian Room service appropriate? Yes; Fluid consistency: Thin  Diet effective now               Body mass index is 31.87 kg/m.  Consultants:see note  Procedures:see note Microbiology:see note Blood Culture No results found for: SDES, SPECREQUEST, CULT, REPTSTATUS  Other culture-see note  Medications: Scheduled Meds: . amLODipine  10 mg Oral Daily  . hydrochlorothiazide  25 mg Oral Daily   Continuous Infusions:  Antimicrobials: Anti-infectives (From admission, onward)   None      Objective: Vitals: Today's Vitals   11/12/19 0600 11/12/19 0800 11/12/19 1647 11/12/19 1658  BP: (!) 168/98  (!) 176/110 (!) 177/108  Pulse: 61  73 70  Resp: 18   20  Temp: 98.6 F (37 C)   97.8 F (36.6 C)  TempSrc: Oral   Oral  SpO2: 94%   96%  Weight:      Height:      PainSc:  6       Intake/Output Summary (Last 24 hours) at 11/12/2019 1703 Last data filed at 11/12/2019 1000 Gross per 24 hour  Intake --  Output 702 ml  Net -702 ml  Filed Weights   11/11/19 0110  Weight: 106.6 kg   Weight change:    Intake/Output from previous day: 07/17 0701 - 07/18 0700 In: -  Out: 700 [Urine:700] Intake/Output this shift: Total I/O In: -  Out: 2 [Urine:1; Stool:1]  Examination:  General exam: AAOx3,NAD, weak appearing. HEENT:Oral mucosa moist, Ear/Nose WNL grossly,dentition normal. Respiratory system: bilaterally clear,no wheezing or crackles,no use of accessory muscle, non tender. Cardiovascular system: S1 & S2 +, regular, No JVD. Gastrointestinal system: Abdomen soft, NT,ND, BS+. Nervous System:Alert, awake, moving  extremities and grossly nonfocal Extremities: No edema, distal peripheral pulses palpable.  Skin: No rashes,no icterus. MSK: Normal muscle bulk,tone, power  Data Reviewed: I have personally reviewed following labs and imaging studies CBC: Recent Labs  Lab 11/10/19 2346 11/12/19 0648  WBC 12.2* 6.7  NEUTROABS 10.1*  --   HGB 13.3 12.5*  HCT 40.4 37.9*  MCV 95.5 94.3  PLT 168 660   Basic Metabolic Panel: Recent Labs  Lab 11/10/19 2346 11/12/19 0648  NA 142 142  K 3.9 3.4*  CL 103 104  CO2 26 26  GLUCOSE 157* 98  BUN 10 11  CREATININE 0.86 1.13  CALCIUM 9.2 8.9   GFR: Estimated Creatinine Clearance: 91 mL/min (by C-G formula based on SCr of 1.13 mg/dL). Liver Function Tests: No results for input(s): AST, ALT, ALKPHOS, BILITOT, PROT, ALBUMIN in the last 168 hours. No results for input(s): LIPASE, AMYLASE in the last 168 hours. No results for input(s): AMMONIA in the last 168 hours. Coagulation Profile: No results for input(s): INR, PROTIME in the last 168 hours. Cardiac Enzymes: No results for input(s): CKTOTAL, CKMB, CKMBINDEX, TROPONINI in the last 168 hours. BNP (last 3 results) No results for input(s): PROBNP in the last 8760 hours. HbA1C: No results for input(s): HGBA1C in the last 72 hours. CBG: No results for input(s): GLUCAP in the last 168 hours. Lipid Profile: No results for input(s): CHOL, HDL, LDLCALC, TRIG, CHOLHDL, LDLDIRECT in the last 72 hours. Thyroid Function Tests: No results for input(s): TSH, T4TOTAL, FREET4, T3FREE, THYROIDAB in the last 72 hours. Anemia Panel: No results for input(s): VITAMINB12, FOLATE, FERRITIN, TIBC, IRON, RETICCTPCT in the last 72 hours. Sepsis Labs: No results for input(s): PROCALCITON, LATICACIDVEN in the last 168 hours.  Recent Results (from the past 240 hour(s))  SARS Coronavirus 2 by RT PCR (hospital order, performed in [facility] hospital lab) Nasopharyngeal Nasopharyngeal Swab     Status: None   Collection  Time: 11/11/19  6:32 AM   Specimen: Nasopharyngeal Swab  Result Value Ref Range Status   SARS Coronavirus 2 NEGATIVE NEGATIVE Final    Comment: (NOTE) SARS-CoV-2 target nucleic acids are NOT DETECTED.  The SARS-CoV-2 RNA is generally detectable in upper and lower respiratory specimens during the acute phase of infection. The lowest concentration of SARS-CoV-2 viral copies this assay can detect is 250 copies / mL. A negative result does not preclude SARS-CoV-2 infection and should not be used as the sole basis for treatment or other patient management decisions.  A negative result may occur with improper specimen collection / handling, submission of specimen other than nasopharyngeal swab, presence of viral mutation(s) within the areas targeted by this assay, and inadequate number of viral copies (<250 copies / mL). A negative result must be combined with clinical observations, patient history, and epidemiological information.  Fact Sheet for Patients:   StrictlyIdeas.no  Fact Sheet for Healthcare Providers: BankingDealers.co.za  This test is not yet approved or  cleared by  the [employer] and has been authorized for detection and/or diagnosis of SARS-CoV-2 by FDA under an Emergency Use Authorization (EUA).  This EUA will remain in effect (meaning this test can be used) for the duration of the COVID-19 declaration under Section 564(b)(1) of the Act, 21 U.S.C. section 360bbb-3(b)(1), unless the authorization is terminated or revoked sooner.  Performed at [facility], [place] [address], [place], [place] [address]       Radiology Studies: CT Head Wo Contrast  Result Date: 11/11/2019 CLINICAL DATA:  Dizziness and vomiting. EXAM: CT HEAD WITHOUT CONTRAST TECHNIQUE: Contiguous axial images were obtained from the base of the skull through the vertex without intravenous contrast. COMPARISON:  None. FINDINGS: Brain:  No evidence of acute infarction, hemorrhage, hydrocephalus, extra-axial collection or mass lesion/mass effect. Very mild areas of chronic appearing white matter low attenuation are seen within the frontal lobes, bilaterally. Vascular: No hyperdense vessel or unexpected calcification. Skull: Normal. Negative for fracture or focal lesion. Sinuses/Orbits: No acute finding. Other: None. IMPRESSION: 1. No acute intracranial abnormality. 2. Very mild bilateral frontal lobe chronic appearing white matter disease. Electronically Signed   By: Virgina Norfolk M.D.   On: 11/11/2019 01:14   MR BRAIN WO CONTRAST  Result Date: 11/11/2019 CLINICAL DATA:  Central vertigo.  Dizziness and vomiting. EXAM: MRI HEAD WITHOUT CONTRAST TECHNIQUE: Multiplanar, multiecho pulse sequences of the brain and surrounding structures were obtained without intravenous contrast. COMPARISON:  CT head 11/11/2019 FINDINGS: Brain: Diagnostic quality images were obtained. The patient was not able to complete axial T1 and coronal T2 images. Negative for acute infarct. Mild chronic microvascular ischemic changes in the white matter. 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 Ventricle size normal. Multiple areas of chronic microhemorrhage including cerebellum bilaterally. Right temporal lobe, and right thalamus. Vascular: Normal arterial flow voids Skull and upper cervical spine: No focal skeletal lesion. Sinuses/Orbits: Mild mucosal edema paranasal sinuses. Right mastoid effusion. Normal orbit Other: None IMPRESSION: Negative for acute infarct. Mild chronic microvascular ischemic change in the white matter. Multiple areas of chronic microhemorrhage in the brain. Correlate with hypertension history. Electronically Signed   By: Franchot Gallo M.D.   On: 11/11/2019 10:18     LOS: 0 days   Antonieta Pert, MD [facility]  11/12/2019, 5:03 PM

## 2019-11-13 DIAGNOSIS — R42 Dizziness and giddiness: Secondary | ICD-10-CM | POA: Diagnosis not present

## 2019-11-13 LAB — BASIC METABOLIC PANEL
Anion gap: 9 (ref 5–15)
BUN: 13 mg/dL (ref 6–20)
CO2: 27 mmol/L (ref 22–32)
Calcium: 9.2 mg/dL (ref 8.9–10.3)
Chloride: 101 mmol/L (ref 98–111)
Creatinine, Ser: 1.11 mg/dL (ref 0.61–1.24)
GFR calc Af Amer: >60 mL/min (ref >60)
GFR calc non Af Amer: >60 mL/min (ref >60)
Glucose, Bld: 98 mg/dL (ref 70–99)
Potassium: 3.4 mmol/L — ABNORMAL LOW (ref 3.5–5.1)
Sodium: 137 mmol/L (ref 135–145)

## 2019-11-13 MED ORDER — POTASSIUM CHLORIDE CRYS ER 20 MEQ PO TBCR: 20.0000 meq | EXTENDED_RELEASE_TABLET | Freq: Every day | ORAL | 0 refills | Status: DC

## 2019-11-13 MED ORDER — HYDROCHLOROTHIAZIDE 25 MG PO TABS: 25.0000 mg | ORAL_TABLET | Freq: Every day | ORAL | 1 refills | Status: DC

## 2019-11-13 MED ORDER — POTASSIUM CHLORIDE CRYS ER 20 MEQ PO TBCR
20.0000 meq | EXTENDED_RELEASE_TABLET | Freq: Every day | ORAL | Status: DC
  Administered 2019-11-13: 20 meq via ORAL
  Filled 2019-11-13: qty 1

## 2019-11-13 MED ORDER — HYDRALAZINE HCL 25 MG PO TABS: 25.0000 mg | ORAL_TABLET | Freq: Three times a day (TID) | ORAL | 1 refills | Status: DC

## 2019-11-13 MED ORDER — MECLIZINE HCL 25 MG PO TABS: 25.0000 mg | ORAL_TABLET | Freq: Three times a day (TID) | ORAL | 0 refills | Status: DC | PRN

## 2019-11-13 MED ORDER — AMLODIPINE BESYLATE 10 MG PO TABS: 10.0000 mg | ORAL_TABLET | Freq: Every day | ORAL | 1 refills | Status: DC

## 2019-11-13 NOTE — Discharge Summary (Signed)
Physician Discharge Summary  Alexander Davis QMG:[id number] DOB: 1960/09/15 DOA: 11/10/2019  PCP: Glean Hess, MD  Admit date: 11/10/2019 Discharge date: 11/13/2019  Admitted From: HOME Disposition:  HOME  Recommendations for Outpatient Follow-up:  1. Follow up with PCP in 1-2 weeks 2. Please obtain BMP/CBC in one week 3. Please follow up on the following pending results:  Home Health:NO . OP PT Equipment/Devices: NONE  Discharge Condition: Stable Code Status: FULL Diet recommendation:  Diet Order            Diet - low sodium heart healthy           Diet vegetarian Room service appropriate? Yes; Fluid consistency: Thin  Diet effective now                  Brief/Interim Summary:  59 year old male with hypertension asthma child presented to the ED with dizziness which started off phentermine since last evening around 9 PM. He had previous couple episodes of vomiting. Denied any weakness headache. Symptoms are more often when he moves. In the ED CT head unremarkable EKG normal sinus rhythm was given Antivert and Ativan but dizziness persisted and was admitted for further maangement MRI showed no acute finding but chronic microhemorrhagic changes likely from uncontrolled hypertension. Seen by PT management blood pressure control with meclizine.  Dizziness vertigo has symptomatically improved.  He feels well today and was able to walk in the hallway. He is medically stable for discharge home on new antihypertensive regimen.  Discharge Diagnoses:  Dizziness/lightheadedness/vertigo:Unclear etiology.  MRI negative stroke but shows mild chronic microvascular ischemic changes in the white matter and multiple areas of chronic microhemorrhages in the brain likely from uncontrolled hypertension.  Symptomatically improved.  Suspect could be related to vertigo versus uncontrolled hypertension.  BP is stabilized on new regimen.  Seen by PT and referred for outpatient PT.  At this  time patient medically stable feels well and wants to go home.   Hypertension urgency, poorly controlled blood pressure noncompliant with amlodipine taking only sporadically.  BP is stablized now he will cont on amlodipine 10 mg, HCTZ 20 mg and hydralazine 20 mg 3 times daily, monitor blood pressure closely at home and follow-up with PCP procedures.  Discussed about lifestyle modification low-salt diet.  Continue 20 KCl daily while on HCTZ.  Follow-up BMP in 1 week.  Patient reports he does not like taking a lot of medication and asking when he can call his blood pressure medication.  I told him this will be ongoing process that he needs to follow-up with his PCP.  Mild hypokalemia replaced will keep on potassium chloride while on HCTZ.    Leucocytosis:unclear etiology.Resolved  Consults:  PT     Subjective: Feeling well this am.  Discharge Exam: Vitals:   11/13/19 1127 11/13/19 1140  BP: (!) 171/99 (!) 162/98  Pulse: 66   Resp:    Temp:    SpO2:     General: Pt is alert, awake, not in acute distress Cardiovascular: RRR, S1/S2 +, no rubs, no gallops Respiratory: CTA bilaterally, no wheezing, no rhonchi Abdominal: Soft, NT, ND, bowel sounds + Extremities: no edema, no cyanosis  Discharge Instructions  Discharge Instructions    Diet - low sodium heart healthy   Complete by: As directed    Discharge instructions   Complete by: As directed    Please monitor blood pressure at home if remains persistently high above 160 you may need to go up on your medications to  follow-up with your primary care doctor.  If blood pressure starts to go low 100 or below it may need to hold your medication.  Check blood chemistry panel for potassium level in a week  Please call call MD or return to ER for similar or worsening recurring problem that brought you to hospital or if any fever,nausea/vomiting,abdominal pain, uncontrolled pain, chest pain,  shortness of breath or any other alarming  symptoms.  Please follow-up your doctor as instructed in a week time and call the office for appointment.  Please avoid alcohol, smoking, or any other illicit substance and maintain healthy habits including taking your regular medications as prescribed.  You were cared for by a hospitalist during your hospital stay. If you have any questions about your discharge medications or the care you received while you were in the hospital after you are discharged, you can call the unit and ask to speak with the hospitalist on call if the hospitalist that took care of you is not available.  Once you are discharged, your primary care physician will handle any further medical issues. Please note that NO REFILLS for any discharge medications will be authorized once you are discharged, as it is imperative that you return to your primary care physician (or establish a relationship with a primary care physician if you do not have one) for your aftercare needs so that they can reassess your need for medications and monitor your lab values   Increase activity slowly   Complete by: As directed      Allergies as of 11/13/2019      Reactions   Fish Allergy Anaphylaxis   Peanut-containing Drug Products Anaphylaxis, Swelling   Lips swell   Penicillins Anaphylaxis   Has patient had a PCN reaction causing immediate rash, facial/tongue/throat swelling, SOB or lightheadedness with hypotension: Unknown Has patient had a PCN reaction causing severe rash involving mucus membranes or skin necrosis: Unknown Has patient had a PCN reaction that required hospitalization: Unknown Has patient had a PCN reaction occurring within the last 10 years: No If all of the above answers are "NO", then may proceed with Cephalosporin use.      Medication List    TAKE these medications   amLODipine 10 MG tablet Commonly known as: NORVASC Take 1 tablet (10 mg total) by mouth daily. What changed: Another medication with the same name was  removed. Continue taking this medication, and follow the directions you see here.   hydrALAZINE 25 MG tablet Commonly known as: APRESOLINE Take 1 tablet (25 mg total) by mouth 3 (three) times daily.   hydrochlorothiazide 25 MG tablet Commonly known as: HYDRODIURIL Take 1 tablet (25 mg total) by mouth daily. Start taking on: November 14, 2019   meclizine 25 MG tablet Commonly known as: ANTIVERT Take 1 tablet (25 mg total) by mouth 3 (three) times daily as needed for dizziness.   NETTLE LEAF PO Take by mouth.   OLIVE LEAF PO Take by mouth.   OVER THE COUNTER MEDICATION Take 1 Dose by mouth daily. Colloidal Silver liquid   potassium chloride SA 20 MEQ tablet Commonly known as: KLOR-CON Take 1 tablet (20 mEq total) by mouth daily for 14 days. Start taking on: November 14, 2019   VITAMIN C PO Take 1 tablet by mouth daily.   vitamin E 45 MG (100 UNITS) capsule Take 100 Units by mouth daily.       Follow-up Information    Glean Hess, MD Follow up in 5  day(s).   Specialty: Internal Medicine Contact information: [address] [place] [address] [phone]              Allergies  Allergen Reactions  . Fish Allergy Anaphylaxis  . Peanut-Containing Drug Products Anaphylaxis and Swelling    Lips swell  . Penicillins Anaphylaxis    Has patient had a PCN reaction causing immediate rash, facial/tongue/throat swelling, SOB or lightheadedness with hypotension: Unknown Has patient had a PCN reaction causing severe rash involving mucus membranes or skin necrosis: Unknown Has patient had a PCN reaction that required hospitalization: Unknown Has patient had a PCN reaction occurring within the last 10 years: No If all of the above answers are "NO", then may proceed with Cephalosporin use.      The results of significant diagnostics from this hospitalization (including imaging, microbiology, ancillary and laboratory) are listed below for reference.     Microbiology: Recent Results (from the past 240 hour(s))  SARS Coronavirus 2 by RT PCR (hospital order, performed in [facility] hospital lab) Nasopharyngeal Nasopharyngeal Swab     Status: None   Collection Time: 11/11/19  6:32 AM   Specimen: Nasopharyngeal Swab  Result Value Ref Range Status   SARS Coronavirus 2 NEGATIVE NEGATIVE Final    Comment: (NOTE) SARS-CoV-2 target nucleic acids are NOT DETECTED.  The SARS-CoV-2 RNA is generally detectable in upper and lower respiratory specimens during the acute phase of infection. The lowest concentration of SARS-CoV-2 viral copies this assay can detect is 250 copies / mL. A negative result does not preclude SARS-CoV-2 infection and should not be used as the sole basis for treatment or other patient management decisions.  A negative result may occur with improper specimen collection / handling, submission of specimen other than nasopharyngeal swab, presence of viral mutation(s) within the areas targeted by this assay, and inadequate number of viral copies (<250 copies / mL). A negative result must be combined with clinical observations, patient history, and epidemiological information.  Fact Sheet for Patients:   StrictlyIdeas.no  Fact Sheet for Healthcare Providers: BankingDealers.co.za  This test is not yet approved or  cleared by the [place] FDA and has been authorized for detection and/or diagnosis of SARS-CoV-2 by FDA under an Emergency Use Authorization (EUA).  This EUA will remain in effect (meaning this test can be used) for the duration of the COVID-19 declaration under Section 564(b)(1) of the Act, 21 U.S.C. section 360bbb-3(b)(1), unless the authorization is terminated or revoked sooner.  Performed at [facility], [place] [address], [place], [place] [address]     Procedures/Studies: CT Head Wo Contrast  Result Date: 11/11/2019 CLINICAL DATA:   Dizziness and vomiting. EXAM: CT HEAD WITHOUT CONTRAST TECHNIQUE: Contiguous axial images were obtained from the base of the skull through the vertex without intravenous contrast. COMPARISON:  None. FINDINGS: Brain: No evidence of acute infarction, hemorrhage, hydrocephalus, extra-axial collection or mass lesion/mass effect. Very mild areas of chronic appearing white matter low attenuation are seen within the frontal lobes, bilaterally. Vascular: No hyperdense vessel or unexpected calcification. Skull: Normal. Negative for fracture or focal lesion. Sinuses/Orbits: No acute finding. Other: None. IMPRESSION: 1. No acute intracranial abnormality. 2. Very mild bilateral frontal lobe chronic appearing white matter disease. Electronically Signed   By: Virgina Norfolk M.D.   On: 11/11/2019 01:14   MR BRAIN WO CONTRAST  Result Date: 11/11/2019 CLINICAL DATA:  Central vertigo.  Dizziness and vomiting. EXAM: MRI HEAD WITHOUT CONTRAST TECHNIQUE: Multiplanar, multiecho pulse sequences  of the brain and surrounding structures were obtained without intravenous contrast. COMPARISON:  CT head 11/11/2019 FINDINGS: Brain: Diagnostic quality images were obtained. The patient was not able to complete axial T1 and coronal T2 images. Negative for acute infarct. Mild chronic microvascular ischemic changes in the white matter. 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 Ventricle size normal. Multiple areas of chronic microhemorrhage including cerebellum bilaterally. Right temporal lobe, and right thalamus. Vascular: Normal arterial flow voids Skull and upper cervical spine: No focal skeletal lesion. Sinuses/Orbits: Mild mucosal edema paranasal sinuses. Right mastoid effusion. Normal orbit Other: None IMPRESSION: Negative for acute infarct. Mild chronic microvascular ischemic change in the white matter. Multiple areas of chronic microhemorrhage in the brain. Correlate with hypertension history. Electronically Signed   By: Franchot Gallo M.D.    On: 11/11/2019 10:18    Labs: BNP (last 3 results) No results for input(s): BNP in the last 8760 hours. Basic Metabolic Panel: Recent Labs  Lab 11/10/19 2346 11/12/19 0648 11/13/19 0539  NA 142 142 137  K 3.9 3.4* 3.4*  CL 103 104 101  CO2 26 26 27   GLUCOSE 157* 98 98  BUN 10 11 13   CREATININE 0.86 1.13 1.11  CALCIUM 9.2 8.9 9.2   Liver Function Tests: No results for input(s): AST, ALT, ALKPHOS, BILITOT, PROT, ALBUMIN in the last 168 hours. No results for input(s): LIPASE, AMYLASE in the last 168 hours. No results for input(s): AMMONIA in the last 168 hours. CBC: Recent Labs  Lab 11/10/19 2346 11/12/19 0648  WBC 12.2* 6.7  NEUTROABS 10.1*  --   HGB 13.3 12.5*  HCT 40.4 37.9*  MCV 95.5 94.3  PLT 168 162   Cardiac Enzymes: No results for input(s): CKTOTAL, CKMB, CKMBINDEX, TROPONINI in the last 168 hours. BNP: Invalid input(s): POCBNP CBG: No results for input(s): GLUCAP in the last 168 hours. D-Dimer No results for input(s): DDIMER in the last 72 hours. Hgb A1c No results for input(s): HGBA1C in the last 72 hours. Lipid Profile No results for input(s): CHOL, HDL, LDLCALC, TRIG, CHOLHDL, LDLDIRECT in the last 72 hours. Thyroid function studies No results for input(s): TSH, T4TOTAL, T3FREE, THYROIDAB in the last 72 hours.  Invalid input(s): FREET3 Anemia work up No results for input(s): VITAMINB12, FOLATE, FERRITIN, TIBC, IRON, RETICCTPCT in the last 72 hours. Urinalysis    Component Value Date/Time   COLORURINE YELLOW 11/10/2019 2353   APPEARANCEUR HAZY (A) 11/10/2019 2353   LABSPEC 1.012 11/10/2019 2353   PHURINE 8.0 11/10/2019 2353   GLUCOSEU NEGATIVE 11/10/2019 2353   HGBUR NEGATIVE 11/10/2019 2353   [place] NEGATIVE 11/10/2019 2353   KETONESUR NEGATIVE 11/10/2019 2353   PROTEINUR NEGATIVE 11/10/2019 2353   NITRITE NEGATIVE 11/10/2019 2353   LEUKOCYTESUR NEGATIVE 11/10/2019 2353   Sepsis Labs Invalid input(s): PROCALCITONIN,  WBC,   LACTICIDVEN Microbiology Recent Results (from the past 240 hour(s))  SARS Coronavirus 2 by RT PCR (hospital order, performed in [place] hospital lab) Nasopharyngeal Nasopharyngeal Swab     Status: None   Collection Time: 11/11/19  6:32 AM   Specimen: Nasopharyngeal Swab  Result Value Ref Range Status   SARS Coronavirus 2 NEGATIVE NEGATIVE Final    Comment: (NOTE) SARS-CoV-2 target nucleic acids are NOT DETECTED.  The SARS-CoV-2 RNA is generally detectable in upper and lower respiratory specimens during the acute phase of infection. The lowest concentration of SARS-CoV-2 viral copies this assay can detect is 250 copies / mL. A negative result does not preclude SARS-CoV-2 infection and should not  be used as the sole basis for treatment or other patient management decisions.  A negative result may occur with improper specimen collection / handling, submission of specimen other than nasopharyngeal swab, presence of viral mutation(s) within the areas targeted by this assay, and inadequate number of viral copies (<250 copies / mL). A negative result must be combined with clinical observations, patient history, and epidemiological information.  Fact Sheet for Patients:   StrictlyIdeas.no  Fact Sheet for Healthcare Providers: BankingDealers.co.za  This test is not yet approved or  cleared by the [place] FDA and has been authorized for detection and/or diagnosis of SARS-CoV-2 by FDA under an Emergency Use Authorization (EUA).  This EUA will remain in effect (meaning this test can be used) for the duration of the COVID-19 declaration under Section 564(b)(1) of the Act, 21 U.S.C. section 360bbb-3(b)(1), unless the authorization is terminated or revoked sooner.  Performed at [facility], [place] [address] [place], [place] [address]    Time coordinating discharge: 25 minutes  SIGNED: Antonieta Pert, MD  [facility] 11/13/2019, 12:35 PM  If 7PM-7AM, please contact night-coverage [email or web address]

## 2019-11-13 NOTE — Progress Notes (Signed)
Physical Therapy Treatment Patient Details Name: Alexander Davis MRN: [id number] DOB: 1961-03-07 Today's Date: 11/13/2019    History of Present Illness 59 yo admitted with dizziness with MRI negative. PMhx: HTN, asthma    PT Comments    Patient resting in bed with spouse at bedside. Pt reports dizziness has been gradually improving and he rates it a 1/10 today. Pt able to sit up to EOB with no change in dizziness/symptoms. Pt continues to have Rt beating nystagmus exacerbated by Rt gaze but denied any significant changes in dizziness/symptoms with occular movement today. He did report increased dizziness when looking down at the floor, suspect this is due to reflective/"busy" surface. Pt unsteady with gait and 3 episodes of LOB noted 2/3 with Rt turn. [facility] training provided and educated on gaze stability technique during gait. Pt and spouse educated to use [facility] for mobility and for pt's spouse to walk next to him on his Rt side to steady as needed. Reviewed benefits of OPPT for vestibular rehab. Acute PT will follow, continue to recommend vestibular therapy and training in outpatient setting to improve stability and independence.      Follow Up Recommendations  Outpatient PT (vestibular rehab)     Equipment Recommendations  None recommended by PT    Recommendations for Other Services       Precautions / Restrictions Precautions Precautions: Fall Restrictions Weight Bearing Restrictions: No    Mobility  Bed Mobility Overal bed mobility: Independent           Transfers Overall transfer level: Needs assistance Equipment used: None Transfers: Sit to/from Stand;Stand Pivot Transfers Sit to Stand: Supervision Stand pivot transfers: Supervision       General transfer comment: no overt LOB noted with rising or turning to sit in recliner.   Ambulation/Gait Ambulation/Gait assistance: Min assist Gait Distance (Feet): 200 Feet Assistive device: None;Straight cane Gait  Pattern/deviations: Step-through pattern;Decreased stride length Gait velocity: decr   General Gait Details: pt with slow gait, pt unsteady with Rt turns and 3 episodes of LOB requiring min assist during gait. pt educated on gaze fixation for gait and pt improved with practice. pt educated on safe sequencing with SPC to improve balance. Recommend use of cane and for pt's wife to walk with him once he retuns home.    Stairs             Wheelchair Mobility    Modified Rankin (Stroke Patients Only)       Balance Overall balance assessment: Needs assistance Sitting-balance support: Feet supported Sitting balance-Leahy Scale: Good     Standing balance support: During functional activity;No upper extremity supported;Single extremity supported Standing balance-Leahy Scale: Fair Standing balance comment: good static balance, fair dynamic standing balance.               Cognition Arousal/Alertness: Awake/alert Behavior During Therapy: WFL for tasks assessed/performed Overall Cognitive Status: Within Functional Limits for tasks assessed               Exercises      General Comments        Pertinent Vitals/Pain Pain Assessment: No/denies pain           PT Goals (current goals can now be found in the care plan section) Acute Rehab PT Goals Patient Stated Goal: walk without dizziness PT Goal Formulation: With patient/family Time For Goal Achievement: 11/26/19 Potential to Achieve Goals: Good Progress towards PT goals: Progressing toward goals    Frequency    Min  4X/week      PT Plan Current plan remains appropriate       AM-PAC PT "6 Clicks" Mobility   Outcome Measure  Help needed turning from your back to your side while in a flat bed without using bedrails?: None Help needed moving from lying on your back to sitting on the side of a flat bed without using bedrails?: None Help needed moving to and from a bed to a chair (including a wheelchair)?:  None Help needed standing up from a chair using your arms (e.g., wheelchair or bedside chair)?: None Help needed to walk in hospital room?: A Little Help needed climbing 3-5 steps with a railing? : A Little 6 Click Score: 22    End of Session Equipment Utilized During Treatment: Gait belt Activity Tolerance: Patient tolerated treatment well Patient left: in bed;with call bell/phone within reach;with family/visitor present Nurse Communication: Mobility status PT Visit Diagnosis: Other abnormalities of gait and mobility (R26.89);Dizziness and giddiness (R42)     Time: 0347-4259 PT Time Calculation (min) (ACUTE ONLY): 31 min  Charges:  $Gait Training: 8-22 mins $Therapeutic Activity: 8-22 mins                     Verner Mould, DPT Acute Rehabilitation Services  Office [phone] Pager [phone]  11/13/2019 2:22 PM

## 2019-11-24 ENCOUNTER — Ambulatory Visit: Payer: BC Managed Care – PPO | Admitting: Internal Medicine

## 2019-11-24 ENCOUNTER — Other Ambulatory Visit: Payer: Self-pay

## 2019-11-24 ENCOUNTER — Encounter: Payer: Self-pay | Admitting: Internal Medicine

## 2019-11-24 VITALS — BP 126/84 | HR 68 | Ht 72.0 in | Wt 238.0 lb

## 2019-11-24 DIAGNOSIS — Z1211 Encounter for screening for malignant neoplasm of colon: Secondary | ICD-10-CM

## 2019-11-24 DIAGNOSIS — E876 Hypokalemia: Secondary | ICD-10-CM

## 2019-11-24 DIAGNOSIS — I10 Essential (primary) hypertension: Secondary | ICD-10-CM

## 2019-11-24 MED ORDER — LOSARTAN POTASSIUM 50 MG PO TABS: 50.0000 mg | ORAL_TABLET | Freq: Every day | ORAL | 0 refills | Status: DC

## 2019-11-24 NOTE — Patient Instructions (Addendum)
Start Losartan 50 mg once a day.  Cut the hydralazine back to twice a day for one week then discontinue.  Continue amlodipine, HCTZ and potassium.

## 2019-11-24 NOTE — Progress Notes (Signed)
Date:  11/24/2019   Name:  Alexander Davis   DOB:  11/15/1960   MRN:  [id number]   Chief Complaint: Follow-up (hospital discharge 26th- HTN and vertigo)  Hypertension This is a chronic problem. The problem has been gradually improving since onset. Condition status: 130/90 at home. Pertinent negatives include no blurred vision, headaches, orthopnea, palpitations, peripheral edema or shortness of breath. Past treatments include calcium channel blockers, diuretics and direct vasodilators. The current treatment provides significant improvement.  Dizziness This is a new problem. The problem has been resolved. Pertinent negatives include no chills, fatigue, fever or headaches.   Admitted to [facility] 11/10/19 to 11/13/19. Discharge Diagnoses:  Dizziness/lightheadedness/vertigo:Unclear etiology.  MRInegative stroke but shows mild chronic microvascular ischemic changes in the white matter and multiple areas of chronic microhemorrhages in the brain likely from uncontrolled hypertension.  Symptomatically improved.  Suspect could be related to vertigo versus uncontrolled hypertension.  BP is stabilized on new regimen.  Seen by PT and referred for outpatient PT.  At this time patient medically stable feels well and wants to go home.   Hypertension urgency,poorly controlled blood pressure noncompliant with amlodipine taking only sporadically. BP is stablized now he will cont on amlodipine 10 mg, HCTZ 20 mg and hydralazine 20 mg 3 times daily, monitor blood pressure closely at home and follow-up with PCP procedures.  Discussed about lifestyle modification low-salt diet.  Continue 20 KCl daily while on HCTZ.  Follow-up BMP in 1 week.  Patient reports he does not like taking a lot of medication and asking when he can call his blood pressure medication.  I told him this will be ongoing process that he needs to follow-up with his PCP. Lab Results  Component Value Date   CREATININE 1.11 11/13/2019   BUN 13  11/13/2019   NA 137 11/13/2019   K 3.4 (L) 11/13/2019   CL 101 11/13/2019   CO2 27 11/13/2019   No results found for: CHOL, HDL, LDLCALC, LDLDIRECT, TRIG, CHOLHDL Lab Results  Component Value Date   TSH 1.481 01/19/2017   No results found for: HGBA1C Lab Results  Component Value Date   WBC 6.7 11/12/2019   HGB 12.5 (L) 11/12/2019   HCT 37.9 (L) 11/12/2019   MCV 94.3 11/12/2019   PLT 162 11/12/2019   Lab Results  Component Value Date   ALT 28 01/19/2017   AST 30 01/19/2017   ALKPHOS 110 01/19/2017   BILITOT 0.9 01/19/2017     Review of Systems  Constitutional: Negative for chills, fatigue and fever.  Eyes: Negative for blurred vision.  Respiratory: Negative for chest tightness, shortness of breath and wheezing.   Cardiovascular: Negative for palpitations and orthopnea.  Neurological: Negative for dizziness (vertigo has resolved), light-headedness and headaches.  Psychiatric/Behavioral: Positive for confusion (the hydralazine makes him feel foggy headed).    Patient Active Problem List   Diagnosis Date Noted  . Vertigo 11/11/2019  . Hypertensive urgency 11/11/2019  . Family history of prostate cancer in father 01/19/2017  . Pill dysphagia 11/18/2015  . Erectile dysfunction 11/18/2015  . Hypertension 02/12/2015    Allergies  Allergen Reactions  . Fish Allergy Anaphylaxis  . Peanut-Containing Drug Products Anaphylaxis and Swelling    Lips swell  . Penicillins Anaphylaxis    Has patient had a PCN reaction causing immediate rash, facial/tongue/throat swelling, SOB or lightheadedness with hypotension: Unknown Has patient had a PCN reaction causing severe rash involving mucus membranes or skin necrosis: Unknown Has patient had a PCN  reaction that required hospitalization: Unknown Has patient had a PCN reaction occurring within the last 10 years: No If all of the above answers are "NO", then may proceed with Cephalosporin use.      Past Surgical History:    Procedure Laterality Date  . HERNIA REPAIR     age 39    Social History   Tobacco Use  . Smoking status: Never Smoker  . Smokeless tobacco: Never Used  Vaping Use  . Vaping Use: Never used  Substance Use Topics  . Alcohol use: No    Alcohol/week: 0.0 standard drinks  . Drug use: No     Medication list has been reviewed and updated.  Current Meds  Medication Sig  . amLODipine (NORVASC) 10 MG tablet Take 1 tablet (10 mg total) by mouth daily.  . Ascorbic Acid (VITAMIN C PO) Take 1 tablet by mouth daily.  . hydrALAZINE (APRESOLINE) 25 MG tablet Take 1 tablet (25 mg total) by mouth 3 (three) times daily.  . hydrochlorothiazide (HYDRODIURIL) 25 MG tablet Take 1 tablet (25 mg total) by mouth daily.  . Multiple Vitamins-Minerals (MENS MULTIVITAMIN PO) Take 1 tablet by mouth daily.  . Nettle, Urtica Dioica, (NETTLE LEAF PO) Take by mouth.  . OLIVE LEAF PO Take by mouth.  Marland Kitchen OVER THE COUNTER MEDICATION Take 1 Dose by mouth daily. Colloidal Silver liquid  . potassium chloride SA (KLOR-CON) 20 MEQ tablet Take 1 tablet (20 mEq total) by mouth daily for 14 days.    PHQ 2/9 Scores 11/24/2019 01/19/2017  PHQ - 2 Score 1 0  PHQ- 9 Score 1 -    GAD 7 : Generalized Anxiety Score 11/24/2019  Nervous, Anxious, on Edge 0  Control/stop worrying 0  Worry too much - different things 0  Trouble relaxing 0  Restless 0  Easily annoyed or irritable 0  Afraid - awful might happen 0  Total GAD 7 Score 0    BP Readings from Last 3 Encounters:  11/24/19 126/84  11/13/19 (!) 162/98  10/11/17 (!) 160/100    Physical Exam Vitals and nursing note reviewed.  Constitutional:      General: He is not in acute distress.    Appearance: Normal appearance. He is well-developed.  HENT:     Head: Normocephalic and atraumatic.  Cardiovascular:     Rate and Rhythm: Normal rate and regular rhythm.     Pulses: Normal pulses.     Heart sounds: No murmur heard.   Pulmonary:     Effort: Pulmonary effort  is normal. No respiratory distress.     Breath sounds: No wheezing or rhonchi.  Musculoskeletal:     Cervical back: Normal range of motion and neck supple.     Right lower leg: No edema.     Left lower leg: No edema.  Lymphadenopathy:     Cervical: No cervical adenopathy.  Skin:    General: Skin is warm and dry.     Capillary Refill: Capillary refill takes less than 2 seconds.     Findings: No rash.  Neurological:     General: No focal deficit present.     Mental Status: He is alert and oriented to person, place, and time.  Psychiatric:        Mood and Affect: Mood normal.     Wt Readings from Last 3 Encounters:  11/24/19 (!) 238 lb (108 kg)  11/11/19 235 lb (106.6 kg)  10/11/17 235 lb (106.6 kg)    BP 126/84  Pulse 68   Ht 6' (1.829 m)   Wt (!) 238 lb (108 kg)   SpO2 98%   BMI 32.28 kg/m   Assessment and Plan: 1. Essential hypertension Vertigo felt secondary to uncontrolled HTN Now on amlodipine, hydralazine and hctz along with potassium Will taper off hydralazine and begin losartan Check K+ Follow up in 6 weeks - losartan (COZAAR) 50 MG tablet; Take 1 tablet (50 mg total) by mouth daily.  Dispense: 90 tablet; Refill: 0  2. Hypokalemia Continue supplementation for now - Basic metabolic panel  3. Colon cancer screening Over due for screening - appears to have been scheduled in 2017 at [facility] but no report is seen so he probably did not follow through. - Ambulatory referral to Gastroenterology   Partially dictated using Dragon software. Any errors are unintentional.  Halina Maidens, MD [facility]  11/24/2019

## 2019-11-25 LAB — BASIC METABOLIC PANEL
BUN/Creatinine Ratio: 9 (ref 9–20)
BUN: 11 mg/dL (ref 6–24)
CO2: 22 mmol/L (ref 20–29)
Calcium: 9.7 mg/dL (ref 8.7–10.2)
Chloride: 99 mmol/L (ref 96–106)
Creatinine, Ser: 1.18 mg/dL (ref 0.76–1.27)
GFR calc Af Amer: 79 mL/min/{1.73_m2} (ref >59)
GFR calc non Af Amer: 68 mL/min/{1.73_m2} (ref >59)
Glucose: 100 mg/dL — ABNORMAL HIGH (ref 65–99)
Potassium: 4.2 mmol/L (ref 3.5–5.2)
Sodium: 143 mmol/L (ref 134–144)

## 2019-12-28 ENCOUNTER — Encounter: Payer: BC Managed Care – PPO | Admitting: Internal Medicine

## 2020-01-05 ENCOUNTER — Other Ambulatory Visit: Payer: Self-pay

## 2020-01-05 ENCOUNTER — Ambulatory Visit: Payer: BC Managed Care – PPO | Attending: Internal Medicine

## 2020-01-05 DIAGNOSIS — R42 Dizziness and giddiness: Secondary | ICD-10-CM | POA: Diagnosis not present

## 2020-01-05 NOTE — Therapy (Addendum)
[place] MAIN [facility] SERVICES [address] [place], [place], [address] Phone: [phone]   Fax:  [phone]  Physical Therapy Evaluation  Patient Details  Name: Alexander Davis MRN: [id number] Date of Birth: [birth year] Referring Provider (PT): Halina Maidens   Encounter Date: 01/05/2020   PT End of Session - 01/05/20 1318    Visit Number 1    Number of Visits 9    Date for PT Re-Evaluation 03/01/20    Authorization Type eval: 01/05/20    PT Start Time 1045    PT Stop Time 1135    PT Time Calculation (min) 50 min    Activity Tolerance Patient tolerated treatment well    Behavior During Therapy [facility] for tasks assessed/performed           Past Medical History:  Diagnosis Date  . Asthma    As child   . Hypertension     Past Surgical History:  Procedure Laterality Date  . HERNIA REPAIR     age 42    There were no vitals filed for this visit.    Subjective Assessment - 01/05/20 1304    Subjective Dizziness    Pertinent History Pt was referred to PT for dizziness. Symptoms started in July 2020. Pt presented to the ED on 11/10/19 with dizziness which started the previous evening around 9 PM. He previously had a couple episodes of vomiting. Pt denied any weakness or headache. Symptoms were occurring more often when he moved. In the ED CT head unremarkable EKG normal sinus rhythm was given Antivert and Ativan but dizziness persisted and was admitted for further management. MRI showed no acute finding but chronic microhemorrhagic changes likely from uncontrolled hypertension. Symptomatically improved somewhat over his admission. Pt was seen by PT and referred for outpatient PT. He reports that he was supposed to be called after discharge for an appointment with a specialist however he never received a call. At this time he reports approximately 90% improvement in his symptoms since they started. He continues to report short duration  vertigo when rolling onto his L side in bed.    Diagnostic tests MRI: see history    Patient Stated Goals Resolve vertigo    Currently in Pain? No/denies   Unrelated to current episode            VESTIBULAR AND BALANCE EVALUATION   HISTORY:  Subjective history of current problem: Pt was referred to PT for dizziness. Symptoms started in July 2020. Pt presented to the ED on 11/10/19 with dizziness which started the previous evening around 9 PM. He previously had a couple episodes of vomiting. Pt denied any weakness or headache. Symptoms were occurring more often when he moved. In the ED CT head unremarkable EKG normal sinus rhythm was given Antivert and Ativan but dizziness persisted and was admitted for furthermanagement. MRI showed no acute finding but chronic microhemorrhagic changes likely from uncontrolled hypertension. Symptomatically improved somewhat over his admission. Pt was seen by PT and referred for outpatient PT. He reports that he was supposed to be called after discharge for an appointment with a specialist however he never received a call. At this time he reports approximately 90% improvement in his symptoms since they started. He continues to report short duration vertigo when rolling onto his L side in bed.  Description of dizziness: (vertigo, unsteadiness, lightheadedness, falling, general unsteadiness, whoozy, swimmy-headed sensation, aural fullness) Vertigo Frequency: Daily if he rolls onto his L side  Duration:  Unsure, short duration Symptom nature: (motion provoked, positional, spontaneous, constant, variable, intermittent)  Motion-provoked  Provocative Factors: Rolling onto L side Easing Factors: Moving out of position  Progression of symptoms: (better, worse, no change since onset) Improved History of similar episodes: None  Falls (yes/no): None Number of falls in past 6 months: No  Prior Functional Level: Independent   Auditory complaints (tinnitus, pain,  drainage, hearing loss, aural fullness): intermittent bilateral tinnitus, L aural fullness, ? Hearing loss Vision (diplopia, visual field loss, recent changes, last eye exam): Denies No current headaches, history of headaches previously. No history of migraines  Red Flags: (dysarthria, dysphagia, drop attacks, bowel and bladder changes, recent weight loss/gain) Review of systems negative for red flags.     EXAMINATION  POSTURE: Within normal limits  NEUROLOGICAL SCREEN: (2+ unless otherwise noted.) N=normal  Ab=abnormal  Level Dermatome R L Myotome R L Reflex R L  C3 Anterior Neck N N Sidebend C2-3 N N Jaw CN V    C4 Top of Shoulder N N Shoulder Shrug C4 N N Hoffman's UMN    C5 Lateral Upper Arm N N Shoulder ABD C4-5 N N Biceps C5-6    C6 Lateral Arm/ Thumb N N Arm Flex/ Wrist Ext C5-6 N N Brachiorad. C5-6    C7 Middle Finger N N Arm Ext//Wrist Flex C6-7 N N Triceps C7    C8 4th & 5th Finger N N Flex/ Ext Carpi Ulnaris C8 N N Patellar (L3-4)    T1 Medial Arm N N Interossei T1 N N Gastrocnemius    L2 Medial thigh/groin N N Illiopsoas (L2-3) N N     L3 Lower thigh/med.knee N N Quadriceps (L3-4) N N     L4 Medial leg/lat thigh N N Tibialis Ant (L4-5) N N     L5 Lat. leg & dorsal foot N N EHL (L5) N N     S1 post/lat foot/thigh/leg N N Gastrocnemius (S1-2) N N     S2 Post./med. thigh & leg N N Hamstrings (L4-S3) N N       Cranial Nerves Visual acuity and visual fields are intact  Extraocular muscles are intact  Facial sensation is intact bilaterally  Facial strength is intact bilaterally  Hearing is normal as tested by gross conversation Palate elevates midline, normal phonation  Shoulder shrug strength is intact  Tongue protrudes midline    SOMATOSENSORY:         Sensation           Intact      Diminished         Absent  Light touch WNL      COORDINATION: Deferred  MUSCULOSKELETAL SCREEN: Cervical Spine ROM: WFL and painless in all planes. No gross deficits  identified   ROM: WNL  MMT: WNL  Functional Mobility: Independent for transfers and ambulation without assistive device      POSTURAL CONTROL TESTS:   Clinical Test of Sensory Interaction for Balance    (CTSIB): Deferred    OCULOMOTOR / VESTIBULAR TESTING:  Oculomotor Exam- Room Light  Findings Comments  Ocular Alignment normal   Ocular ROM normal   Spontaneous Nystagmus normal   Gaze-Holding Nystagmus not examined Possible R horizontal beating nystagmus toward mid-range on the right side  End-Gaze Nystagmus normal   Vergence (normal 2-3") normal   Smooth Pursuit normal   Cross-Cover Test not examined   Saccades normal   VOR Cancellation normal   Left Head Impulse abnormal  corrective saccades noted  Right Head Impulse normal   Static Acuity not examined   Dynamic Acuity not examined     Oculomotor Exam- Fixation Suppressed: DeferredBPPV TESTS:  Symptoms Duration Intensity Nystagmus  L Dix-Hallpike None   None  R Dix-Hallpike None   Possibly a few R horizontal (geotrophic) beats  L Head Roll Vertigo 5-10s 4/10 Upbeating L torsional nystagmus  R Head Roll None   None  L Sidelying Test      R Sidelying Test        FUNCTIONAL OUTCOME MEASURES   Results Comments  FOTO 73 Predicted improvement: 88  DHI 24/100        [facility] PT Assessment - 01/05/20 1313      Assessment   Medical Diagnosis Dizziness    Referring Provider (PT) Halina Maidens    Onset Date/Surgical Date 11/09/19    Next MD Visit Not reported    Prior Therapy None for dizziness      Precautions   Precautions None      Restrictions   Weight Bearing Restrictions No      Balance Screen   Has the patient fallen in the past 6 months No    Has the patient had a decrease in activity level because of a fear of falling?  No    Is the patient reluctant to leave their home because of a fear of falling?  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 1 minute holds in each position and retesting between maneuvers.  During retesting patient is negative for both vertigo and nystagmus..        Objective measurements completed on examination: See above findings.               PT Education - 01/05/20 1318    Education Details Plan of care    Person(s) Educated Patient    Methods Explanation    Comprehension Verbalized understanding            PT Short Term Goals - 01/05/20 1322      PT SHORT TERM GOAL #1   Title Pt will be independent with HEP in order to decrease dizziness and improve function at home.    Time 4    Period Weeks    Status New    Target Date 03/01/20             PT Long Term Goals - 01/05/20 1324      PT LONG TERM GOAL #1   Title Pt will decrease DHI score by at least 18 points in order to demonstrate clinically significant reduction in disability    Baseline 01/05/20: 24/100    Time 8    Period Weeks    Status New    Target Date 03/01/20      PT LONG TERM GOAL #2   Title Pt will report 100% resolution of his vertigo when rolling in bed so that he can sleep on his left side without  any symptoms    Time 8    Period Weeks    Status New    Target Date 03/01/20      PT LONG TERM GOAL #3   Title Pt will improve FOTO score to at least 88 to demonstrate significant improvement in his function    Baseline 01/05/20: 73    Time 8    Status New    Target Date 03/01/20                  Plan - 01/05/20 1319    Clinical Impression Statement Pt is a pleasant 59 year-old male referred for dizziness.  Patient  reports that his symptoms have improved approximately 90% since they started.  He continues to have vertigo when rolling onto his left side in bed.  PT examination today reveals what appears to be left posterior canal BPPV.  Patient treated with 2 bouts of the Epley maneuver for the left side today during evaluation.  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  Patient also reports some continued dizziness with quick head motions.  He arrived late for the initial evaluation so further testing will have to be deferred to the second session.  Patient also is complaining of some increased DOE and mild heart palpitations.  Patient encouraged to discuss these concerns with his PCP to determine if he needs further work-up for cardiac issues.  Therapist also contacted PCP with these concerns.  Patient is complaining of left unilateral hearing loss and he would benefit from a referral to ENT for an audiogram and if necessary VNG study. Pt will benefit from skilled PT services to address deficits in dizziness and improved symptom free function at home.    Personal Factors and Comorbidities Comorbidity 1;Time since onset of injury/illness/exacerbation;Past/Current Experience    Comorbidities HTN    Examination-Activity Limitations Other   Rolling, quick head turns   Examination-Participation Restrictions Community Activity    Stability/Clinical Decision Making Stable/Uncomplicated    Clinical Decision Making Low    Rehab Potential Excellent    PT Frequency 1x / week    PT Duration 8 weeks    PT Treatment/Interventions ADLs/Self Care Home Management;Aquatic Therapy;Biofeedback;Canalith Repostioning;Cryotherapy;Electrical Stimulation;Iontophoresis 4mg /ml Dexamethasone;Moist Heat;Traction;Ultrasound;DME Instruction;Gait training;Stair training;Functional mobility  training;Therapeutic activities;Therapeutic exercise;Balance training;Neuromuscular re-education;Patient/family education;Manual techniques;Passive range of motion;Dry needling;Vestibular;Joint Manipulations    PT Next Visit Plan BERG, FGA, DVA, infrared fixation suppression testing, initiate HEP    PT Home Exercise Plan None currently    Consulted and Agree with Plan of Care Patient           Patient will benefit from skilled therapeutic intervention in order to improve the following deficits and impairments:  Dizziness  Visit Diagnosis: Dizziness and giddiness     Problem List Patient Active Problem List   Diagnosis Date Noted  . Vertigo 11/11/2019  . Hypertensive urgency 11/11/2019  . Family history of prostate cancer in father 01/19/2017  . Pill dysphagia 11/18/2015  . Erectile dysfunction 11/18/2015  . Hypertension 02/12/2015   Phillips Grout PT, DPT, GCS  Lafayette Dunlevy 01/05/2020, 1:35 PM  [place] MAIN [facility] SERVICES [address] [place], [place], [address] Phone: [phone]   Fax:  [phone]  Name: Alexander Davis MRN: [id number] Date of Birth: [birth year]

## 2020-01-12 ENCOUNTER — Other Ambulatory Visit: Payer: Self-pay

## 2020-01-12 ENCOUNTER — Ambulatory Visit: Payer: BC Managed Care – PPO

## 2020-01-12 DIAGNOSIS — R42 Dizziness and giddiness: Secondary | ICD-10-CM

## 2020-01-12 NOTE — Patient Instructions (Addendum)
Access Code: [email or web address] URL: [email or web address] Date: 01/12/2020 Prepared by: Roxana Hires  Exercises Standing Gaze Stabilization with Head Rotation - 4 x daily - 7 x weekly - 3 reps - 60 seconds hold

## 2020-01-12 NOTE — Therapy (Signed)
[place] MAIN [facility] SERVICES [address] [place], [place], [address] Phone: [phone]   Fax:  [phone]  Physical Therapy Treatment  Patient Details  Name: Alexander Davis MRN: [id number] Date of Birth: 26-Nov-1960 Referring Provider (PT): Halina Maidens   Encounter Date: 01/12/2020   PT End of Session - 01/12/20 1117    Visit Number 2    Number of Visits 9    Date for PT Re-Evaluation 03/01/20    Authorization Type eval: 01/05/20    PT Start Time 0925    PT Stop Time 1010    PT Time Calculation (min) 45 min    Equipment Utilized During Treatment Gait belt    Activity Tolerance Patient tolerated treatment well    Behavior During Therapy [facility] for tasks assessed/performed           Past Medical History:  Diagnosis Date  . Asthma    As child   . Hypertension     Past Surgical History:  Procedure Laterality Date  . HERNIA REPAIR     age 74    There were no vitals filed for this visit.   Subjective Assessment - 01/12/20 0929    Subjective Pt reports that his symptoms are significantly improved since the initial evaluation. He feels like his balance is still slightly impaired. He also finished his course of HCTZ and believes that his dizziness has also improved since that time. He has been able to sleep on his L side without any vertigo. He reports that his DOE is improved since stopping his HCTZ as well.    Pertinent History Pt was referred to PT for dizziness. Symptoms started in July 2020. Pt presented to the ED on 11/10/19 with dizziness which started the previous evening around 9 PM. He previously had a couple episodes of vomiting. Pt denied any weakness or headache. Symptoms were occurring more often when he moved. In the ED CT head unremarkable EKG normal sinus rhythm was given Antivert and Ativan but dizziness persisted and was admitted for further management. MRI showed no acute finding but chronic microhemorrhagic  changes likely from uncontrolled hypertension. Symptomatically improved somewhat over his admission. Pt was seen by PT and referred for outpatient PT. He reports that he was supposed to be called after discharge for an appointment with a specialist however he never received a call. At this time he reports approximately 90% improvement in his symptoms since they started. He continues to report short duration vertigo when rolling onto his L side in bed.    Diagnostic tests MRI: see history    Patient Stated Goals Resolve vertigo    Currently in Pain? No/denies              [facility] PT Assessment - 01/12/20 0001      Functional Gait  Assessment   Gait assessed  Yes    Gait Level Surface Walks 20 ft in less than 5.5 sec, no assistive devices, good speed, no evidence for imbalance, normal gait pattern, deviates no more than 6 in outside of the 12 in walkway width.    Change in Gait Speed Able to smoothly change walking speed without loss of balance or gait deviation. Deviate no more than 6 in outside of the 12 in walkway width.    Gait with Horizontal Head Turns Performs head turns smoothly with no change in gait. Deviates no more than 6 in outside 12 in walkway width    Gait with Vertical  Head Turns Performs task with slight change in gait velocity (eg, minor disruption to smooth gait path), deviates 6 - 10 in outside 12 in walkway width or uses assistive device    Gait and Pivot Turn Pivot turns safely within 3 sec and stops quickly with no loss of balance.    Step Over Obstacle Is able to step over 2 stacked shoe boxes taped together (9 in total height) without changing gait speed. No evidence of imbalance.    Gait with Narrow Base of Support Is able to ambulate for 10 steps heel to toe with no staggering.    Gait with Eyes Closed Walks 20 ft, uses assistive device, slower speed, mild gait deviations, deviates 6-10 in outside 12 in walkway width. Ambulates 20 ft in less than 9 sec but greater than 7  sec.    Ambulating Backwards Walks 20 ft, uses assistive device, slower speed, mild gait deviations, deviates 6-10 in outside 12 in walkway width.    Steps Alternating feet, must use rail.    Total Score 26              TREATMENT  Neuromuscular Re-education   Oculomotor Exam- Fixation Suppressed  Findings Comments  Ocular Alignment normal   Spontaneous Nystagmus abnormal Pure horizontal R beating nystagmus at central gaze, does not fatigue;  Gaze-Holding Nystagmus abnormal Pure horizontal R beating nystagmus at R and L midgaze, does not fatigue (positive third degree nystagmus);  End-Gaze Nystagmus abnormal See above  Head Shaking Nystagmus abnormal Increased vigor of spontaneous R beating pure horizontal nystagmus  Pressure-Induced Nystagmus not examined   Hyperventilation Induced Nystagmus not examined   Skull Vibration Induced Nystagmus not examined    POSTURAL CONTROL TESTS:   Clinical Test of Sensory Interaction for Balance    (CTSIB):  CONDITION TIME SWAY  Eyes open, firm surface 30 seconds 1+  Eyes closed, firm surface 30 seconds 2+  Eyes open, foam surface 30 seconds 2+  Eyes closed, foam surface 5 seconds 4+   FGA: 26/30 DVA: static: 20/25 (line 7) dynamic: 20/70 (line 3)  VOR x 1 horizontal in sitting x 60s, no dizziness; VOR x 1 hoirzontal in standing WBOS x 60s, no dizziness (issued for HEP);   Canalith Repositioning Treatment Pt treated with 2 bouts of Epley Maneuver for presumed L posterior canal BPPV. 1 minute holds in each position and retesting between maneuvers.  During retesting patient is negative for both vertigo and nystagmus.   Pt educated throughout session about proper posture and technique with exercises. Improved exercise technique, movement at target joints, use of target muscles after min to mod verbal, visual, tactile cues.    Patient reports significant improvement dizziness since initial evaluation.  Infrared fixation suppression  testing performed today and patient demonstrates third-degree right horizontal beating nystagmus.  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  Patient only able to balance for 5 seconds condition four of the modified CTSIB. FGA reveals high level balance deficits with patient scoring 26/30.  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  BPPV appears fully resolved at this time.  Patient plans to follow-up with his PCP on Monday, therapist encouraged patient to request referral to ENT for audiogram and possible additional testing if necessary.                          PT Short  Term Goals - 01/05/20 1322      PT SHORT TERM GOAL #1   Title Pt will be independent with HEP in order to decrease dizziness and improve function at home.    Time 4    Period Weeks    Status New    Target Date 03/01/20             PT Long Term Goals - 01/05/20 1324      PT LONG TERM GOAL #1   Title Pt will decrease DHI score by at least 18 points in order to demonstrate clinically significant reduction in disability    Baseline 01/05/20: 24/100    Time 8    Period Weeks    Status New    Target Date 03/01/20      PT LONG TERM GOAL #2   Title Pt will report 100% resolution of his vertigo when rolling in bed so that he can sleep on his left side without any symptoms    Time 8    Period Weeks    Status New    Target Date 03/01/20      PT LONG TERM GOAL #3   Title Pt will improve FOTO score to at least 88 to demonstrate significant improvement in his function    Baseline 01/05/20: 73    Time 8    Status New    Target Date 03/01/20                 Plan - 01/12/20 0941    Clinical Impression Statement Patient reports significant improvement dizziness since initial evaluation.  Infrared fixation suppression testing performed today and patient demonstrates third-degree right  horizontal beating nystagmus.  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  Patient only able to balance for 5 seconds condition four of the modified CTSIB. FGA reveals high level balance deficits with patient scoring 26/30.  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  BPPV appears fully resolved at this time.  Patient plans to follow-up with his PCP on Monday, therapist encouraged patient to request referral to ENT for audiogram and possible additional testing if necessary.    Personal Factors and Comorbidities Comorbidity 1;Time since onset of injury/illness/exacerbation;Past/Current Experience    Comorbidities HTN    Examination-Activity Limitations Other   Rolling, quick head turns   Examination-Participation Restrictions Community Activity    Stability/Clinical Decision Making Stable/Uncomplicated    Rehab Potential Excellent    PT Frequency 1x / week    PT Duration 8 weeks    PT Treatment/Interventions ADLs/Self Care Home Management;Aquatic Therapy;Biofeedback;Canalith Repostioning;Cryotherapy;Electrical Stimulation;Iontophoresis 4mg /ml Dexamethasone;Moist Heat;Traction;Ultrasound;DME Instruction;Gait training;Stair training;Functional mobility training;Therapeutic activities;Therapeutic exercise;Balance training;Neuromuscular re-education;Patient/family education;Manual techniques;Passive range of motion;Dry needling;Vestibular;Joint Manipulations    PT Next Visit Plan Continue with adaptation and habituation exercises, recheck for BPPV if needed.    PT Home Exercise Plan Access Code: [email or web address]    Consulted and Agree with Plan of Care Patient           Patient will benefit from skilled therapeutic intervention in order to improve the following deficits and impairments:  Dizziness  Visit Diagnosis: Dizziness and giddiness     Problem List Patient Active Problem List   Diagnosis  Date Noted  . Vertigo 11/11/2019  . Hypertensive urgency 11/11/2019  . Family history of prostate cancer in father 01/19/2017  . Pill dysphagia 11/18/2015  . Erectile dysfunction 11/18/2015  .  Hypertension 02/12/2015   Phillips Grout PT, DPT, GCS  Jru Pense 01/12/2020, 11:47 AM  [place] MAIN [facility] SERVICES [address] [place], [place], [address] Phone: [phone]   Fax:  [phone]  Name: Alexander Davis MRN: [id number] Date of Birth: 1960/10/11

## 2020-01-15 ENCOUNTER — Ambulatory Visit: Payer: BC Managed Care – PPO | Admitting: Internal Medicine

## 2020-01-15 ENCOUNTER — Encounter: Payer: Self-pay | Admitting: Internal Medicine

## 2020-01-15 ENCOUNTER — Other Ambulatory Visit: Payer: Self-pay

## 2020-01-15 VITALS — BP 124/78 | HR 65 | Temp 97.8°F | Ht 72.0 in | Wt 238.0 lb

## 2020-01-15 DIAGNOSIS — R42 Dizziness and giddiness: Secondary | ICD-10-CM

## 2020-01-15 DIAGNOSIS — I10 Essential (primary) hypertension: Secondary | ICD-10-CM | POA: Diagnosis not present

## 2020-01-15 MED ORDER — LOSARTAN POTASSIUM 50 MG PO TABS: 50.0000 mg | ORAL_TABLET | Freq: Every day | ORAL | 1 refills | Status: DC

## 2020-01-15 MED ORDER — AMLODIPINE BESYLATE 10 MG PO TABS: 10.0000 mg | ORAL_TABLET | Freq: Every day | ORAL | 1 refills | Status: DC

## 2020-01-15 NOTE — Progress Notes (Signed)
Date:  01/15/2020   Name:  Alexander Davis   DOB:  07/18/1960   MRN:  [id number]   Chief Complaint: Hypertension (follow up (new med)has been working ) and Referral (ENT- pt said he has no loss of hearing/ doesnt know if he wants to go thru with RF)  Hypertension This is a chronic problem. The problem is controlled (140/90 at home). Pertinent negatives include no chest pain, palpitations or shortness of breath. Past treatments include calcium channel blockers, diuretics and angiotensin blockers (last visit started losartan and tapered off of hydralzine). There are no compliance problems.   Dizziness This is a chronic problem. The problem has been gradually improving. Pertinent negatives include no chest pain, chills, fatigue, fever, myalgias, nausea or vomiting. Treatments tried: has been seeing PT but they believe there may be some other issues and recommend ENT.  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 He feels 90% improved and does not think that he wants to go to ENT at this time.  Lab Results  Component Value Date   CREATININE 1.18 11/24/2019   BUN 11 11/24/2019   NA 143 11/24/2019   K 4.2 11/24/2019   CL 99 11/24/2019   CO2 22 11/24/2019   No results found for: CHOL, HDL, LDLCALC, LDLDIRECT, TRIG, CHOLHDL Lab Results  Component Value Date   TSH 1.481 01/19/2017   No results found for: HGBA1C Lab Results  Component Value Date   WBC 6.7 11/12/2019   HGB 12.5 (L) 11/12/2019   HCT 37.9 (L) 11/12/2019   MCV 94.3 11/12/2019   PLT 162 11/12/2019   Lab Results  Component Value Date   ALT 28 01/19/2017   AST 30 01/19/2017   ALKPHOS 110 01/19/2017   BILITOT 0.9 01/19/2017     Review of Systems  Constitutional: Negative for chills, fatigue and fever.  HENT: Negative for hearing loss.   Respiratory: Negative for chest tightness, shortness of breath and wheezing.   Cardiovascular: Negative for chest pain, palpitations and leg swelling.    Gastrointestinal: Negative for nausea and vomiting.  Musculoskeletal: Negative for myalgias.  Neurological: Positive for dizziness.    Patient Active Problem List   Diagnosis Date Noted  . Vertigo 11/11/2019  . Hypertensive urgency 11/11/2019  . Family history of prostate cancer in father 01/19/2017  . Pill dysphagia 11/18/2015  . Erectile dysfunction 11/18/2015  . Hypertension 02/12/2015    Allergies  Allergen Reactions  . Fish Allergy Anaphylaxis  . Peanut-Containing Drug Products Anaphylaxis and Swelling    Lips swell  . Penicillins Anaphylaxis    Has patient had a PCN reaction causing immediate rash, facial/tongue/throat swelling, SOB or lightheadedness with hypotension: Unknown Has patient had a PCN reaction causing severe rash involving mucus membranes or skin necrosis: Unknown Has patient had a PCN reaction that required hospitalization: Unknown Has patient had a PCN reaction occurring within the last 10 years: No If all of the above answers are "NO", then may proceed with Cephalosporin use.      Past Surgical History:  Procedure Laterality Date  . HERNIA REPAIR     age 59    Social History   Tobacco Use  . Smoking status: Never Smoker  . Smokeless tobacco: Never Used  Vaping Use  . Vaping Use: Never used  Substance Use Topics  . Alcohol use: No    Alcohol/week: 0.0 standard drinks  . Drug use: No     Medication list has been reviewed and updated.  Current Meds  Medication Sig  . Ascorbic Acid (VITAMIN C PO) Take 1 tablet by mouth daily.  Marland Kitchen losartan (COZAAR) 50 MG tablet Take 1 tablet (50 mg total) by mouth daily.  . Multiple Vitamins-Minerals (MENS MULTIVITAMIN PO) Take 1 tablet by mouth daily.  . Nettle, Urtica Dioica, (NETTLE LEAF PO) Take by mouth.  . OLIVE LEAF PO Take by mouth.  Marland Kitchen OVER THE COUNTER MEDICATION Take 1 Dose by mouth daily. Colloidal Silver liquid  . [DISCONTINUED] losartan (COZAAR) 50 MG tablet Take 1 tablet (50 mg total) by mouth  daily.    PHQ 2/9 Scores 11/24/2019 01/19/2017  PHQ - 2 Score 1 0  PHQ- 9 Score 1 -    GAD 7 : Generalized Anxiety Score 11/24/2019  Nervous, Anxious, on Edge 0  Control/stop worrying 0  Worry too much - different things 0  Trouble relaxing 0  Restless 0  Easily annoyed or irritable 0  Afraid - awful might happen 0  Total GAD 7 Score 0    BP Readings from Last 3 Encounters:  01/15/20 124/78  11/24/19 126/84  11/13/19 (!) 162/98    Physical Exam Vitals and nursing note reviewed.  Constitutional:      General: He is not in acute distress.    Appearance: Normal appearance. He is well-developed.  HENT:     Head: Normocephalic and atraumatic.     Right Ear: Tympanic membrane and ear canal normal.     Left Ear: Tympanic membrane and ear canal normal.  Cardiovascular:     Rate and Rhythm: Normal rate and regular rhythm.     Pulses: Normal pulses.  Pulmonary:     Effort: Pulmonary effort is normal. No respiratory distress.  Musculoskeletal:        General: Normal range of motion.     Cervical back: Normal range of motion.     Right lower leg: No edema.     Left lower leg: No edema.  Lymphadenopathy:     Cervical: No cervical adenopathy.  Skin:    General: Skin is warm and dry.     Findings: No rash.  Neurological:     Mental Status: He is alert and oriented to person, place, and time.  Psychiatric:        Behavior: Behavior normal.        Thought Content: Thought content normal.     Wt Readings from Last 3 Encounters:  01/15/20 238 lb (108 kg)  11/24/19 (!) 238 lb (108 kg)  11/11/19 235 lb (106.6 kg)    BP 124/78 (BP Location: Right Arm, Patient Position: Sitting)   Pulse 65   Temp 97.8 F (36.6 C) (Oral)   Ht 6' (1.829 m)   Wt 238 lb (108 kg)   SpO2 97%   BMI 32.28 kg/m   Assessment and Plan: 1. Essential hypertension Patient feels better off of hydralazine Ran out of amlodipine one week ago Does not like side effects of HCTZ - causes muscle  cramps Continue Losartan and resume amlodipine - goal BP 130/80 Stop HCTZ - amLODipine (NORVASC) 10 MG tablet; Take 1 tablet (10 mg total) by mouth daily.  Dispense: 90 tablet; Refill: 1 - losartan (COZAAR) 50 MG tablet; Take 1 tablet (50 mg total) by mouth daily.  Dispense: 90 tablet; Refill: 1  2. Vertigo Much improved Continue with PT until maximum benefit Pt defers ENT referral at this time   Partially dictated using [place]. Any errors are unintentional.  Halina Maidens, MD [facility]  01/15/2020

## 2020-01-15 NOTE — Patient Instructions (Signed)
Average BP goal 130/80

## 2020-01-19 ENCOUNTER — Other Ambulatory Visit: Payer: Self-pay

## 2020-01-19 ENCOUNTER — Ambulatory Visit: Payer: BC Managed Care – PPO

## 2020-01-19 DIAGNOSIS — R42 Dizziness and giddiness: Secondary | ICD-10-CM

## 2020-01-19 NOTE — Patient Instructions (Signed)
Access Code: [email or web address] URL: [email or web address] Date: 01/19/2020 Prepared by: Roxana Hires  Exercises Standing Gaze Stabilization with Head Rotation - 4 x daily - 7 x weekly - 3 reps - 60 seconds hold Tandem Stance with Head Rotation - 2 x daily - 7 x weekly - 30s x 3 with each foot forward hold

## 2020-01-19 NOTE — Therapy (Signed)
[place] MAIN [facility] SERVICES [address] [place], [place], [address] Phone: [phone]   Fax:  [phone]  Physical Therapy Treatment  Patient Details  Name: Alexander Davis MRN: [id number] Date of Birth: 12/10/1960 Referring Provider (PT): Halina Maidens   Encounter Date: 01/19/2020   PT End of Session - 01/19/20 1409    Visit Number 3    Number of Visits 9    Date for PT Re-Evaluation 03/01/20    Authorization Type eval: 01/05/20    PT Start Time 0900    PT Stop Time 0938    PT Time Calculation (min) 38 min    Equipment Utilized During Treatment Gait belt    Activity Tolerance Patient tolerated treatment well    Behavior During Therapy [facility] for tasks assessed/performed           Past Medical History:  Diagnosis Date  . Asthma    As child   . Hypertension     Past Surgical History:  Procedure Laterality Date  . HERNIA REPAIR     age 63    There were no vitals filed for this visit.   Subjective Assessment - 01/19/20 0902    Subjective Pt reports that he continues to improve since starting therapy. He is reporting approximately 90-95% improvement in his symptoms at that time. He denies any further bouts of vertigo but does report a very brief sensation of dizziness when truning quickly onto his L side while laying down. No specific questions or concerns at this time.    Pertinent History Pt was referred to PT for dizziness. Symptoms started in July 2020. Pt presented to the ED on 11/10/19 with dizziness which started the previous evening around 9 PM. He previously had a couple episodes of vomiting. Pt denied any weakness or headache. Symptoms were occurring more often when he moved. In the ED CT head unremarkable EKG normal sinus rhythm was given Antivert and Ativan but dizziness persisted and was admitted for further management. MRI showed no acute finding but chronic microhemorrhagic changes likely from uncontrolled  hypertension. Symptomatically improved somewhat over his admission. Pt was seen by PT and referred for outpatient PT. He reports that he was supposed to be called after discharge for an appointment with a specialist however he never received a call. At this time he reports approximately 90% improvement in his symptoms since they started. He continues to report short duration vertigo when rolling onto his L side in bed.    Diagnostic tests MRI: see history    Patient Stated Goals Resolve vertigo    Currently in Pain? No/denies            TREATMENT   Canalith Repositioning Treatment Roll test and Dix-Hallpike test negative bilaterally for both vertigo and nystagmus.  Given history of positive left Dix-Hallpike test during previous session patient treated with 1 bout of Epley Maneuver for presumed L posterior canal BPPV today. 1 minute holds in each position and retesting afterward. During retesting L Dix-Hallpike test remains negative.   Neuromuscular Re-education  VOR x 1 hoirzontal in standing NBOS x 60s, no dizziness; VOR x 1 horizontal on Airex pad NBOS x 60s, no dizziness; VOR x 1 horizontal on Airex pad NBOS with conflicting background x 60s, no dizziness but notable unsteadiness; VOR x 1 horizontal with forward and backward ambulation x 35' each, no dizziness but notable unsteadiness; Tandem balance with horizontal head turns alternating forward LE x 30s each; Updated HEP issued  with education about how to perform correctly at home;   Pt educated throughout session about proper posture and technique with exercises. Improved exercise technique, movement at target joints, use of target muscles after min to mod verbal, visual, tactile cues.    Patient reports significant improvement dizziness since initial evaluation. He reports approximately 90-95% improvement in his symptoms since starting therapy. All BPPV testing negative on this date. Pt taken through one bout of Epley maneuver  due to previous positive testing. BPPV appears fully resolved at this time. Progressed HEP with patient and while unable to provoke dizziness he does experience considerable unsteadiness in more challenging conditions. Pt educated about benefit of getting an audiogram to assess for L sided hearing loss and to be able to track loss if he has a recurrent acute episode of hearing loss. Pt encouraged to continue his HEP and follow-up as scheduled. Pt will benefit from PT services to address deficits in dizziness and balance in order to return to full function at home.                               PT Education - 01/19/20 1409    Education Details HEP    Person(s) Educated Patient    Methods Explanation;Handout;Demonstration    Comprehension Verbalized understanding;Returned demonstration            PT Short Term Goals - 01/05/20 1322      PT SHORT TERM GOAL #1   Title Pt will be independent with HEP in order to decrease dizziness and improve function at home.    Time 4    Period Weeks    Status New    Target Date 03/01/20             PT Long Term Goals - 01/05/20 1324      PT LONG TERM GOAL #1   Title Pt will decrease DHI score by at least 18 points in order to demonstrate clinically significant reduction in disability    Baseline 01/05/20: 24/100    Time 8    Period Weeks    Status New    Target Date 03/01/20      PT LONG TERM GOAL #2   Title Pt will report 100% resolution of his vertigo when rolling in bed so that he can sleep on his left side without any symptoms    Time 8    Period Weeks    Status New    Target Date 03/01/20      PT LONG TERM GOAL #3   Title Pt will improve FOTO score to at least 88 to demonstrate significant improvement in his function    Baseline 01/05/20: 73    Time 8    Status New    Target Date 03/01/20                 Plan - 01/19/20 1410    Personal Factors and Comorbidities Comorbidity 1;Time since onset of  injury/illness/exacerbation;Past/Current Experience    Comorbidities HTN    Examination-Activity Limitations Other   Rolling, quick head turns   Examination-Participation Restrictions Community Activity    Stability/Clinical Decision Making Stable/Uncomplicated    Rehab Potential Excellent    PT Frequency 1x / week    PT Duration 8 weeks    PT Treatment/Interventions ADLs/Self Care Home Management;Aquatic Therapy;Biofeedback;Canalith Repostioning;Cryotherapy;Electrical Stimulation;Iontophoresis 4mg /ml Dexamethasone;Moist Heat;Traction;Ultrasound;DME Instruction;Gait training;Stair training;Functional mobility training;Therapeutic activities;Therapeutic exercise;Balance training;Neuromuscular re-education;Patient/family education;Manual techniques;Passive  range of motion;Dry needling;Vestibular;Joint Manipulations    PT Next Visit Plan Continue with adaptation and habituation exercises, recheck for BPPV if needed.    PT Home Exercise Plan Access Code: [email or web address]    Consulted and Agree with Plan of Care Patient           Patient will benefit from skilled therapeutic intervention in order to improve the following deficits and impairments:  Dizziness  Visit Diagnosis: Dizziness and giddiness     Problem List Patient Active Problem List   Diagnosis Date Noted  . Vertigo 11/11/2019  . Hypertensive urgency 11/11/2019  . Family history of prostate cancer in father 01/19/2017  . Pill dysphagia 11/18/2015  . Erectile dysfunction 11/18/2015  . Hypertension 02/12/2015   Phillips Grout PT, DPT, GCS  [place] Mi 01/19/2020, 2:23 PM  [place] MAIN [facility] SERVICES [address] [place], [place], [address] Phone: [phone]   Fax:  [phone]  Name: Alexander Davis MRN: [id number] Date of Birth: Apr 07, 1961

## 2020-01-25 NOTE — Patient Instructions (Addendum)
Access Code: [email or web address] URL: [email or web address] Date: 01/26/2020 Prepared by: Roxana Hires  Exercises Standing Gaze Stabilization with Head Rotation - 4 x daily - 7 x weekly - 3 reps - 60 seconds hold Tandem Stance - 2 x daily - 7 x weekly - 30s x 3 with each foot forward hold Tandem Stance with Head Rotation - 2 x daily - 7 x weekly - 30s x 3 with each foot forward hold Single Leg Stance - 2 x daily - 7 x weekly - 30s x 3 with each leg hold

## 2020-01-26 ENCOUNTER — Ambulatory Visit: Payer: BC Managed Care – PPO | Attending: Internal Medicine

## 2020-01-26 ENCOUNTER — Other Ambulatory Visit: Payer: Self-pay

## 2020-01-26 DIAGNOSIS — R42 Dizziness and giddiness: Secondary | ICD-10-CM | POA: Diagnosis present

## 2020-01-26 NOTE — Therapy (Signed)
[place] [facility] MAIN [facility] SERVICES [address] [place], [place], [address] Phone: [phone]   Fax:  [phone]  Physical Therapy Treatment/Discharge  Patient Details  Name: Alexander Davis MRN: [id number] Date of Birth: [birth year] Referring Provider (PT): Bari Edward   Encounter Date: 01/26/2020   PT End of Session - 01/26/20 0927    Visit Number 4    Number of Visits 9    Date for PT Re-Evaluation 03/01/20    Authorization Type eval: 01/05/20    PT Start Time 0920    PT Stop Time 1000    PT Time Calculation (min) 40 min    Equipment Utilized During Treatment Gait belt    Activity Tolerance Patient tolerated treatment well    Behavior During Therapy [facility] for tasks assessed/performed           Past Medical History:  Diagnosis Date  . Asthma    As child   . Hypertension     Past Surgical History:  Procedure Laterality Date  . HERNIA REPAIR     age 37    There were no vitals filed for this visit.   Subjective Assessment - 01/26/20 0922    Subjective Pt reports that he continues to improve since starting therapy. He is reporting approximately 97% improvement in his symptoms at that time. He denies any further bouts of vertigo but does report a very brief sensation of dizziness when truning quickly onto his L side while laying down. No specific questions or concerns at this time.    Pertinent History Pt was referred to PT for dizziness. Symptoms started in July 2020. Pt presented to the ED on 11/10/19 with dizziness which started the previous evening around 9 PM. He previously had a couple episodes of vomiting. Pt denied any weakness or headache. Symptoms were occurring more often when he moved. In the ED CT head unremarkable EKG normal sinus rhythm was given Antivert and Ativan but dizziness persisted and was admitted for further management. MRI showed no acute finding but chronic microhemorrhagic changes likely from uncontrolled  hypertension. Symptomatically improved somewhat over his admission. Pt was seen by PT and referred for outpatient PT. He reports that he was supposed to be called after discharge for an appointment with a specialist however he never received a call. At this time he reports approximately 90% improvement in his symptoms since they started. He continues to report short duration vertigo when rolling onto his L side in bed.    Diagnostic tests MRI: see history    Patient Stated Goals Resolve vertigo    Currently in Pain? No/denies               TREATMENT   Neuromuscular Re-education  Pt completed DHI: 8/100 and FOTO: 82  Roll test and Dix-Hallpike test negative bilaterally for both vertigo and nystagmus.   Pt reports 97% symptom resolution since starting therapy; VOR x 1 horizontal with forward and backward ambulation 3 x 35' each, no dizziness but notable unsteadiness and staggering; Tandem balance in // bars without UE support alternating forward LE x 30s each Tandem balance in // bars without UE support with horizontal head turns alternating forward LE x 30s each; Single leg balance practice alternating LE x 30s on each side; 5TSTS: 11.2s Updated HEP issued with education about how to perform correctly at home;   Pt educated throughout session about proper posture and technique with exercises. Improved exercise technique, movement at target joints, use of  target muscles after min to mod verbal, visual, tactile cues.    Patient reports significant improvement dizziness since initial evaluation. He reports approximately 97% improvement in his symptoms since starting therapy. All BPPV testing negative again on this date and it appears that his BPPV is fully resolved. Progressed HEP with patient and while unable to provoke dizziness he does experience considerable unsteadiness in more challenging conditions. DHI and FOTO scores both improved significantly. Pt is ready to discharge at  this time. Pt educated about benefit of getting an audiogram to assess for L sided hearing loss and to be able to track loss if he has a recurrent acute episode of hearing loss. Pt encouraged to follow up with MD if symptoms return or worsen. Pt will be discharged on this date reporting 97% resolution of symptoms.                           PT Short Term Goals - 01/26/20 0929      PT SHORT TERM GOAL #1   Title Pt will be independent with HEP in order to decrease dizziness and improve function at home.    Time 4    Period Weeks    Status Achieved             PT Long Term Goals - 01/26/20 0929      PT LONG TERM GOAL #1   Title Pt will decrease DHI score by at least 18 points in order to demonstrate clinically significant reduction in disability    Baseline 01/05/20: 24/100; 01/26/20: 8/100    Time 8    Period Weeks    Status Partially Met      PT LONG TERM GOAL #2   Title Pt will report 100% resolution of his vertigo when rolling in bed so that he can sleep on his left side without any symptoms    Baseline 01/26/20: 100% resolution of vertigo    Time 8    Period Weeks    Status Achieved      PT LONG TERM GOAL #3   Title Pt will improve FOTO score to at least 88 to demonstrate significant improvement in his function    Baseline 01/05/20: 73; 01/26/20: 82    Time 8    Period Weeks    Status Partially Met    Target Date --                 Plan - 01/26/20 0927    Clinical Impression Statement Patient reports significant improvement dizziness since initial evaluation. He reports approximately 97% improvement in his symptoms since starting therapy. All BPPV testing negative again on this date and it appears that his BPPV is fully resolved. Progressed HEP with patient and while unable to provoke dizziness he does experience considerable unsteadiness in more challenging conditions. DHI and FOTO scores both improved significantly. Pt is ready to discharge at  this time. Pt educated about benefit of getting an audiogram to assess for L sided hearing loss and to be able to track loss if he has a recurrent acute episode of hearing loss. Pt encouraged to follow up with MD if symptoms return or worsen. Pt will be discharged on this date reporting 97% resolution of symptoms.    Personal Factors and Comorbidities Comorbidity 1;Time since onset of injury/illness/exacerbation;Past/Current Experience    Comorbidities HTN    Examination-Activity Limitations Other   Rolling, quick head turns   Examination-Participation Restrictions  Community Activity    Stability/Clinical Decision Making Stable/Uncomplicated    Rehab Potential Excellent    PT Frequency 1x / week    PT Duration 8 weeks    PT Treatment/Interventions ADLs/Self Care Home Management;Aquatic Therapy;Biofeedback;Canalith Repostioning;Cryotherapy;Electrical Stimulation;Iontophoresis 6m/ml Dexamethasone;Moist Heat;Traction;Ultrasound;DME Instruction;Gait training;Stair training;Functional mobility training;Therapeutic activities;Therapeutic exercise;Balance training;Neuromuscular re-education;Patient/family education;Manual techniques;Passive range of motion;Dry needling;Vestibular;Joint Manipulations    PT Next Visit Plan Discharge    PT Home Exercise Plan Access Code: [email or web address]    RDEYCXKGYand Agree with Plan of Care Patient           Patient will benefit from skilled therapeutic intervention in order to improve the following deficits and impairments:  Dizziness  Visit Diagnosis: Dizziness and giddiness     Problem List Patient Active Problem List   Diagnosis Date Noted  . Vertigo 11/11/2019  . Hypertensive urgency 11/11/2019  . Family history of prostate cancer in father 01/19/2017  . Pill dysphagia 11/18/2015  . Erectile dysfunction 11/18/2015  . Hypertension 02/12/2015   Alexander GroutPT, DPT, GCS  Alexander Davis 01/26/2020, 10:30 AM  [place] [facility] [facility] [address] [place] [address]Phone: [phone]  Fax:  [phone] Name: Alexander BRIDEAUMRN: [id number] of Birth: 318-Jun-1962

## 2020-02-09 ENCOUNTER — Ambulatory Visit: Payer: BC Managed Care – PPO

## 2020-04-12 ENCOUNTER — Telehealth: Payer: Self-pay | Admitting: Internal Medicine

## 2020-04-12 NOTE — Telephone Encounter (Signed)
Called pt left VM. To increase losartan to 100 MG. Take 2- 50 MG that pt is currently taken. Call back to schedule one month follow up. Pt name stated on VM.  KP

## 2020-04-12 NOTE — Telephone Encounter (Signed)
Will increase dose of losartan to 100 mg. (can take 2 of the current 50 mg tabs).  Schedule a follow up in one month.

## 2020-04-12 NOTE — Telephone Encounter (Signed)
Patient called to request that the doctor change his medication, amLODipine (NORVASC) 10 MG tablet, because he is having bad side effects.  He would like her to prescribe an alternative medication.  Please advise and call patient to discuss at [phone]

## 2020-04-12 NOTE — Telephone Encounter (Signed)
Please review. Spoke to pt he said he was having SOB, swelling in ankles, severe constipation and fatigue. Pt is not taken medication at this time. Pt said his side effects went away after stopping med.   KP

## 2020-05-06 ENCOUNTER — Telehealth: Payer: Self-pay | Admitting: Internal Medicine

## 2020-05-06 ENCOUNTER — Other Ambulatory Visit: Payer: Self-pay

## 2020-05-06 DIAGNOSIS — I10 Essential (primary) hypertension: Secondary | ICD-10-CM

## 2020-05-06 MED ORDER — LOSARTAN POTASSIUM 100 MG PO TABS: 100.0000 mg | ORAL_TABLET | Freq: Every day | ORAL | 0 refills | Status: DC

## 2020-05-06 NOTE — Telephone Encounter (Signed)
Called and left VM for patient informing him I sent in 30 days of 100 mg of Losartan for RF to [facility]. He needs to follow up at his scheduled appt with Dr Army Melia on 01/21 at 320 PM. Told him to call back if he has any questions.

## 2020-05-06 NOTE — Telephone Encounter (Signed)
Patient called to ask the nurse to call him to discuss his medication for Losartan.  Patient was told to take a double dosage and now only has 1 dosage available.  He is not sure if he should request another script.  Please advise and call patient to discuss at [phone]

## 2020-05-17 ENCOUNTER — Ambulatory Visit: Payer: BC Managed Care – PPO | Admitting: Internal Medicine

## 2020-06-11 ENCOUNTER — Encounter: Payer: Self-pay | Admitting: Internal Medicine

## 2020-06-11 ENCOUNTER — Other Ambulatory Visit: Payer: Self-pay

## 2020-06-11 ENCOUNTER — Ambulatory Visit: Payer: BC Managed Care – PPO | Admitting: Internal Medicine

## 2020-06-11 VITALS — BP 146/100 | HR 76 | Ht 72.0 in | Wt 240.0 lb

## 2020-06-11 DIAGNOSIS — I10 Essential (primary) hypertension: Secondary | ICD-10-CM

## 2020-06-11 MED ORDER — IRBESARTAN 300 MG PO TABS: 300.0000 mg | ORAL_TABLET | Freq: Every day | ORAL | 0 refills | Status: DC

## 2020-06-11 NOTE — Progress Notes (Signed)
Date:  06/11/2020   Name:  Alexander Davis   DOB:  14-May-1960   MRN:  [id number]   Chief Complaint: Hypertension (1 month follow up. Having side effects to losartan. Dizziness and fatigue. Wants to change to a different BP medicine. )  Hypertension This is a chronic problem. The problem is uncontrolled. Pertinent negatives include no chest pain, headaches, palpitations or shortness of breath. Past treatments include calcium channel blockers, angiotensin blockers and diuretics. Compliance problems include medication side effects (did not tolerate amlodipine or hctz).  There is no history of kidney disease, CAD/MI or CVA.    Lab Results  Component Value Date   CREATININE 1.18 11/24/2019   BUN 11 11/24/2019   NA 143 11/24/2019   K 4.2 11/24/2019   CL 99 11/24/2019   CO2 22 11/24/2019   No results found for: CHOL, HDL, LDLCALC, LDLDIRECT, TRIG, CHOLHDL Lab Results  Component Value Date   TSH 1.481 01/19/2017   No results found for: HGBA1C Lab Results  Component Value Date   WBC 6.7 11/12/2019   HGB 12.5 (L) 11/12/2019   HCT 37.9 (L) 11/12/2019   MCV 94.3 11/12/2019   PLT 162 11/12/2019   Lab Results  Component Value Date   ALT 28 01/19/2017   AST 30 01/19/2017   ALKPHOS 110 01/19/2017   BILITOT 0.9 01/19/2017     Review of Systems  Constitutional: Negative for chills, fatigue, fever and unexpected weight change.  Respiratory: Negative for cough, chest tightness, shortness of breath and wheezing.   Cardiovascular: Negative for chest pain, palpitations and leg swelling.  Musculoskeletal: Negative for arthralgias and myalgias.  Neurological: Positive for light-headedness (occasional sx - about once a week). Negative for dizziness and headaches.  Psychiatric/Behavioral: Negative for dysphoric mood and sleep disturbance. The patient is not nervous/anxious.     Patient Active Problem List   Diagnosis Date Noted  . Vertigo 11/11/2019  . Family history of prostate  cancer in father 01/19/2017  . Pill dysphagia 11/18/2015  . Erectile dysfunction 11/18/2015  . Hypertension 02/12/2015    Allergies  Allergen Reactions  . Fish Allergy Anaphylaxis  . Peanut-Containing Drug Products Anaphylaxis and Swelling    Lips swell  . Penicillins Anaphylaxis    Has patient had a PCN reaction causing immediate rash, facial/tongue/throat swelling, SOB or lightheadedness with hypotension: Unknown Has patient had a PCN reaction causing severe rash involving mucus membranes or skin necrosis: Unknown Has patient had a PCN reaction that required hospitalization: Unknown Has patient had a PCN reaction occurring within the last 10 years: No If all of the above answers are "NO", then may proceed with Cephalosporin use.      Past Surgical History:  Procedure Laterality Date  . HERNIA REPAIR     age 5    Social History   Tobacco Use  . Smoking status: Never Smoker  . Smokeless tobacco: Never Used  Vaping Use  . Vaping Use: Never used  Substance Use Topics  . Alcohol use: No    Alcohol/week: 0.0 standard drinks  . Drug use: No     Medication list has been reviewed and updated.  Current Meds  Medication Sig  . Ascorbic Acid (VITAMIN C PO) Take 1 tablet by mouth daily.  Marland Kitchen losartan (COZAAR) 100 MG tablet Take 1 tablet (100 mg total) by mouth daily.  . Multiple Vitamins-Minerals (MENS MULTIVITAMIN PO) Take 1 tablet by mouth daily.  . Nettle, Urtica Dioica, (NETTLE LEAF PO) Take by mouth.  Marland Kitchen  OLIVE LEAF PO Take by mouth.  Marland Kitchen OVER THE COUNTER MEDICATION Take 1 Dose by mouth daily. Colloidal Silver liquid    PHQ 2/9 Scores 06/11/2020 11/24/2019 01/19/2017  PHQ - 2 Score 0 1 0  PHQ- 9 Score 0 1 -    GAD 7 : Generalized Anxiety Score 06/11/2020 11/24/2019  Nervous, Anxious, on Edge 0 0  Control/stop worrying 0 0  Worry too much - different things 0 0  Trouble relaxing 0 0  Restless 0 0  Easily annoyed or irritable 0 0  Afraid - awful might happen 0 0  Total  GAD 7 Score 0 0  Anxiety Difficulty Not difficult at all -    BP Readings from Last 3 Encounters:  06/11/20 (!) 146/100  01/15/20 124/78  11/24/19 126/84    Physical Exam Vitals and nursing note reviewed.  Constitutional:      General: He is not in acute distress.    Appearance: Normal appearance. He is well-developed.  HENT:     Head: Normocephalic and atraumatic.  Cardiovascular:     Rate and Rhythm: Normal rate and regular rhythm.     Pulses: Normal pulses.     Heart sounds: No murmur heard.   Pulmonary:     Effort: Pulmonary effort is normal. No respiratory distress.     Breath sounds: No wheezing or rhonchi.  Musculoskeletal:     Cervical back: Normal range of motion.     Right lower leg: No edema.     Left lower leg: No edema.  Lymphadenopathy:     Cervical: No cervical adenopathy.  Skin:    General: Skin is warm and dry.     Findings: No rash.  Neurological:     General: No focal deficit present.     Mental Status: He is alert and oriented to person, place, and time.  Psychiatric:        Mood and Affect: Mood normal.        Behavior: Behavior normal.     Wt Readings from Last 3 Encounters:  06/11/20 240 lb (108.9 kg)  01/15/20 238 lb (108 kg)  11/24/19 (!) 238 lb (108 kg)    BP (!) 146/100   Pulse 76   Ht 6' (1.829 m)   Wt 240 lb (108.9 kg)   SpO2 97%   BMI 32.55 kg/m   Assessment and Plan: 1. Primary hypertension Intolerant of hctz and norvasc; currently on losartan but out for the past 2 days Unclear is losartan was controlling his BP Will change to irbesartan 300 mg - follow up in 4 weeks Call earlier if medication side effects DASH diet recommended - irbesartan (AVAPRO) 300 MG tablet; Take 1 tablet (300 mg total) by mouth daily.  Dispense: 30 tablet; Refill: 0   Partially dictated using Editor, commissioning. Any errors are unintentional.  Halina Maidens, MD [facility]  06/11/2020

## 2020-06-11 NOTE — Patient Instructions (Signed)

## 2020-07-03 ENCOUNTER — Ambulatory Visit: Payer: BC Managed Care – PPO | Admitting: Internal Medicine

## 2020-07-12 ENCOUNTER — Ambulatory Visit: Payer: BC Managed Care – PPO | Admitting: Internal Medicine

## 2020-07-29 ENCOUNTER — Ambulatory Visit (INDEPENDENT_AMBULATORY_CARE_PROVIDER_SITE_OTHER): Payer: BC Managed Care – PPO | Admitting: Internal Medicine

## 2020-07-29 ENCOUNTER — Encounter: Payer: Self-pay | Admitting: Internal Medicine

## 2020-07-29 ENCOUNTER — Other Ambulatory Visit: Payer: Self-pay

## 2020-07-29 VITALS — BP 142/118 | HR 77 | Temp 97.9°F | Ht 72.0 in | Wt 238.0 lb

## 2020-07-29 DIAGNOSIS — I10 Essential (primary) hypertension: Secondary | ICD-10-CM | POA: Diagnosis not present

## 2020-07-29 MED ORDER — IRBESARTAN 300 MG PO TABS: 300.0000 mg | ORAL_TABLET | Freq: Every day | ORAL | 1 refills | Status: DC

## 2020-07-29 NOTE — Progress Notes (Signed)
Date:  07/29/2020   Name:  Alexander Davis   DOB:  [birth year]   MRN:  [id number]   Chief Complaint: Hypertension (Wasn't taking meds every day, took last tablet today, BP was 211/141 )  Hypertension This is a chronic problem. The current episode started more than 1 year ago. The problem is uncontrolled. Pertinent negatives include no chest pain, headaches, palpitations or shortness of breath. Treatments tried: failed amlodipine, hctz, losartan due to side effects;  Irbesartan started 2 mo ago but not taking it daily. The current treatment provides no improvement. Compliance problems include diet (high salt diet intermittently).  There is no history of kidney disease, CAD/MI, CVA or heart failure.    Lab Results  Component Value Date   CREATININE 1.18 11/24/2019   BUN 11 11/24/2019   NA 143 11/24/2019   K 4.2 11/24/2019   CL 99 11/24/2019   CO2 22 11/24/2019   No results found for: CHOL, HDL, LDLCALC, LDLDIRECT, TRIG, CHOLHDL Lab Results  Component Value Date   TSH 1.481 01/19/2017   No results found for: HGBA1C Lab Results  Component Value Date   WBC 6.7 11/12/2019   HGB 12.5 (L) 11/12/2019   HCT 37.9 (L) 11/12/2019   MCV 94.3 11/12/2019   PLT 162 11/12/2019   Lab Results  Component Value Date   ALT 28 01/19/2017   AST 30 01/19/2017   ALKPHOS 110 01/19/2017   BILITOT 0.9 01/19/2017     Review of Systems  Constitutional: Positive for fatigue (had mild fatigue with irbesartan). Negative for chills and unexpected weight change.  HENT: Negative for nosebleeds.   Eyes: Negative for visual disturbance.  Respiratory: Negative for cough, chest tightness, shortness of breath and wheezing.   Cardiovascular: Negative for chest pain, palpitations and leg swelling.  Gastrointestinal: Negative for abdominal pain, constipation and diarrhea.  Neurological: Negative for dizziness, weakness, light-headedness and headaches.    Patient Active Problem List   Diagnosis Date  Noted  . Vertigo 11/11/2019  . Family history of prostate cancer in father 01/19/2017  . Pill dysphagia 11/18/2015  . Erectile dysfunction 11/18/2015  . Hypertension 02/12/2015    Allergies  Allergen Reactions  . Fish Allergy Anaphylaxis  . Norvasc [Amlodipine] Swelling  . Peanut-Containing Drug Products Anaphylaxis and Swelling    Lips swell  . Penicillins Anaphylaxis    Has patient had a PCN reaction causing immediate rash, facial/tongue/throat swelling, SOB or lightheadedness with hypotension: Unknown Has patient had a PCN reaction causing severe rash involving mucus membranes or skin necrosis: Unknown Has patient had a PCN reaction that required hospitalization: Unknown Has patient had a PCN reaction occurring within the last 10 years: No If all of the above answers are "NO", then may proceed with Cephalosporin use.    Marland Kitchen Hctz [Hydrochlorothiazide] Other (See Comments)    Muscle cramps    Past Surgical History:  Procedure Laterality Date  . HERNIA REPAIR     age 22    Social History   Tobacco Use  . Smoking status: Never Smoker  . Smokeless tobacco: Never Used  Vaping Use  . Vaping Use: Never used  Substance Use Topics  . Alcohol use: No    Alcohol/week: 0.0 standard drinks  . Drug use: No     Medication list has been reviewed and updated.  Current Meds  Medication Sig  . Ascorbic Acid (VITAMIN C PO) Take 1 tablet by mouth daily.  . irbesartan (AVAPRO) 300 MG tablet Take 1 tablet (  300 mg total) by mouth daily.  . Nettle, Urtica Dioica, (NETTLE LEAF PO) Take by mouth.  . OLIVE LEAF PO Take by mouth.    PHQ 2/9 Scores 07/29/2020 06/11/2020 11/24/2019 01/19/2017  PHQ - 2 Score 1 0 1 0  PHQ- 9 Score 1 0 1 -    GAD 7 : Generalized Anxiety Score 07/29/2020 06/11/2020 11/24/2019  Nervous, Anxious, on Edge 1 0 0  Control/stop worrying 1 0 0  Worry too much - different things 1 0 0  Trouble relaxing 0 0 0  Restless 0 0 0  Easily annoyed or irritable 0 0 0  Afraid -  awful might happen 0 0 0  Total GAD 7 Score 3 0 0  Anxiety Difficulty - Not difficult at all -    BP Readings from Last 3 Encounters:  07/29/20 (!) 142/118  06/11/20 (!) 146/100  01/15/20 124/78    Physical Exam Vitals and nursing note reviewed.  Constitutional:      General: He is not in acute distress.    Appearance: Normal appearance. He is well-developed.  HENT:     Head: Normocephalic and atraumatic.  Cardiovascular:     Rate and Rhythm: Normal rate and regular rhythm.     Pulses: Normal pulses.     Heart sounds: No murmur heard.   Pulmonary:     Effort: Pulmonary effort is normal. No respiratory distress.     Breath sounds: No wheezing or rhonchi.  Musculoskeletal:     Cervical back: Normal range of motion.     Right lower leg: No edema.     Left lower leg: No edema.  Lymphadenopathy:     Cervical: No cervical adenopathy.  Skin:    General: Skin is warm and dry.     Findings: No rash.  Neurological:     Mental Status: He is alert and oriented to person, place, and time.  Psychiatric:        Mood and Affect: Mood normal.        Behavior: Behavior normal.     Wt Readings from Last 3 Encounters:  07/29/20 238 lb (108 kg)  06/11/20 240 lb (108.9 kg)  01/15/20 238 lb (108 kg)    BP (!) 142/118   Pulse 77   Temp 97.9 F (36.6 C) (Oral)   Ht 6' (1.829 m)   Wt 238 lb (108 kg)   SpO2 96%   BMI 32.28 kg/m   Assessment and Plan: 1. Primary hypertension Long discussion regarding the genetic component of HTN, along with the need to follow a low sodium diet and to take the medication every day.  He plans to set a phone alarm to help. He should expect some mild fatigue for the first month or so of therapy. He is open to the [facility] if we can not get him under control in the next few months. Follow up in one month. DASH diet has been given in the past. - irbesartan (AVAPRO) 300 MG tablet; Take 1 tablet (300 mg total) by mouth daily.  Dispense: 30  tablet; Refill: 1   Partially dictated using Editor, commissioning. Any errors are unintentional.  Halina Maidens, MD [facility]  07/29/2020

## 2020-08-22 ENCOUNTER — Ambulatory Visit: Payer: Self-pay | Admitting: *Deleted

## 2020-08-22 NOTE — Telephone Encounter (Signed)
Patient is calling to report he thinks he may be having a reaction to some food he ate- he states this has happened to him years ago. Patient stats he has swelling in left jaw line to under the ear.This started Monday. Patient has been icing, increased water, lemon tea. Patient does not have itching- but has pain to touch.  Advised per protocol- Urgent care today- patient states he can wait until tomorrow- will continue to monitor symptoms and if has changes will go to UC.   Reason for Disposition . [1] Swelling is red AND [2] very painful to touch  Answer Assessment - Initial Assessment Questions 1. ONSET: "When did the swelling start?" (e.g., minutes, hours, days)     Monday 2. LOCATION: "What part of the face is swollen?"     Left- below the ear to jawline 3. SEVERITY: "How swollen is it?"     puffy 4. ITCHING: "Is there any itching?" If Yes, ask: "How much?"   (Scale 1-10; mild, moderate or severe)     no 5. PAIN: "Is the swelling painful to touch?" If Yes, ask: "How painful is it?"   (Scale 1-10; mild, moderate or severe)     Yes- sore to the touch 6. FEVER: "Do you have a fever?" If Yes, ask: "What is it, how was it measured, and when did it start?"      no 7. CAUSE: "What do you think is causing the face swelling?"     Food allergy 8. RECURRENT SYMPTOM: "Have you had face swelling before?" If Yes, ask: "When was the last time?" "What happened that time?"     Similar reaction in the past 9. OTHER SYMPTOMS: "Do you have any other symptoms?" (e.g., toothache, leg swelling)     Left knee stiff- slight swelling 10. PREGNANCY: "Is there any chance you are pregnant?" "When was your last menstrual period?"       n/a  Protocols used: [facility]

## 2020-08-22 NOTE — Telephone Encounter (Signed)
Noted  Pt was advised to go to UC. But will wait until tomorrow to be seen in office on 08/23/2020.  KP

## 2020-08-23 ENCOUNTER — Other Ambulatory Visit: Payer: Self-pay

## 2020-08-23 ENCOUNTER — Ambulatory Visit (INDEPENDENT_AMBULATORY_CARE_PROVIDER_SITE_OTHER): Payer: BC Managed Care – PPO | Admitting: Internal Medicine

## 2020-08-23 ENCOUNTER — Encounter: Payer: Self-pay | Admitting: Internal Medicine

## 2020-08-23 VITALS — BP 164/102 | HR 80 | Temp 98.4°F | Ht 72.0 in | Wt 233.0 lb

## 2020-08-23 DIAGNOSIS — I10 Essential (primary) hypertension: Secondary | ICD-10-CM

## 2020-08-23 DIAGNOSIS — L03211 Cellulitis of face: Secondary | ICD-10-CM

## 2020-08-23 MED ORDER — SULFAMETHOXAZOLE-TRIMETHOPRIM 800-160 MG PO TABS: 1.0000 | ORAL_TABLET | Freq: Two times a day (BID) | ORAL | 0 refills | Status: DC

## 2020-08-23 NOTE — Progress Notes (Signed)
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 Friends eat different cultural foods. Noticed he broke out with a spot on his left face - thought it was a boil. Painful to touch. Burst on its own yesterday- felt better. Pus drained out. Washed face and put Band-Aid on it. Had a boil in the past in the same spot.)  HPI Noticed swelling on left side of face several days ago.  Yesterday drained purulent material.  Today drained a small amount.  Much less painful.  Cleaning with peroxide and alcohol. No injury.  Thought it might be a reaction to eating unusual foods while on a volunteer trip.  Lab Results  Component Value Date   CREATININE 1.18 11/24/2019   BUN 11 11/24/2019   NA 143 11/24/2019   K 4.2 11/24/2019   CL 99 11/24/2019   CO2 22 11/24/2019   No results found for: CHOL, HDL, LDLCALC, LDLDIRECT, TRIG, CHOLHDL Lab Results  Component Value Date   TSH 1.481 01/19/2017   No results found for: HGBA1C Lab Results  Component Value Date   WBC 6.7 11/12/2019   HGB 12.5 (L) 11/12/2019   HCT 37.9 (L) 11/12/2019   MCV 94.3 11/12/2019   PLT 162 11/12/2019   Lab Results  Component Value Date   ALT 28 01/19/2017   AST 30 01/19/2017   ALKPHOS 110 01/19/2017   BILITOT 0.9 01/19/2017     Review of Systems  Constitutional: Negative for chills, fatigue and fever.  HENT: Positive for facial swelling. Negative for dental problem and trouble swallowing.   Respiratory: Negative for chest tightness and shortness of breath.   Cardiovascular: Negative for chest pain.  Skin: Positive for wound.  Neurological: Negative for dizziness, light-headedness and headaches.    Patient Active Problem List   Diagnosis Date Noted  . Vertigo 11/11/2019  . Family history of prostate cancer in father 01/19/2017  . Pill dysphagia 11/18/2015  .  Erectile dysfunction 11/18/2015  . Hypertension 02/12/2015    Allergies  Allergen Reactions  . Fish Allergy Anaphylaxis  . Norvasc [Amlodipine] Swelling  . Peanut-Containing Drug Products Anaphylaxis and Swelling    Lips swell  . Penicillins Anaphylaxis    Has patient had a PCN reaction causing immediate rash, facial/tongue/throat swelling, SOB or lightheadedness with hypotension: Unknown Has patient had a PCN reaction causing severe rash involving mucus membranes or skin necrosis: Unknown Has patient had a PCN reaction that required hospitalization: Unknown Has patient had a PCN reaction occurring within the last 10 years: No If all of the above answers are "NO", then may proceed with Cephalosporin use.    Marland Kitchen Hctz [Hydrochlorothiazide] Other (See Comments)    Muscle cramps    Past Surgical History:  Procedure Laterality Date  . HERNIA REPAIR     age 48    Social History   Tobacco Use  . Smoking status: Never Smoker  . Smokeless tobacco: Never Used  Vaping Use  . Vaping Use: Never used  Substance Use Topics  . Alcohol use: No    Alcohol/week: 0.0 standard drinks  . Drug use: No     Medication list has been reviewed and updated.  No outpatient medications have been marked as taking for the 08/23/20 encounter (Office Visit) with Glean Hess, MD.    [facility] 2/9 Scores  08/23/2020 07/29/2020 06/11/2020 11/24/2019  PHQ - 2 Score 2 1 0 1  PHQ- 9 Score 2 1 0 1    GAD 7 : Generalized Anxiety Score 08/23/2020 07/29/2020 06/11/2020 11/24/2019  Nervous, Anxious, on Edge 2 1 0 0  Control/stop worrying 1 1 0 0  Worry too much - different things 1 1 0 0  Trouble relaxing 0 0 0 0  Restless 0 0 0 0  Easily annoyed or irritable 1 0 0 0  Afraid - awful might happen 0 0 0 0  Total GAD 7 Score 5 3 0 0  Anxiety Difficulty Somewhat difficult - Not difficult at all -    BP Readings from Last 3 Encounters:  08/23/20 (!) 164/102  07/29/20 (!) 142/118  06/11/20 (!) 146/100    Physical  Exam Constitutional:      Appearance: Normal appearance.  HENT:     Head:      Comments: Area of induration; shallow 3 mm ulcer without drainage or bleeding Cardiovascular:     Rate and Rhythm: Normal rate and regular rhythm.  Pulmonary:     Effort: Pulmonary effort is normal.     Breath sounds: No wheezing or rhonchi.  Lymphadenopathy:     Cervical: No cervical adenopathy.  Neurological:     Mental Status: He is alert.     Wt Readings from Last 3 Encounters:  08/23/20 233 lb (105.7 kg)  07/29/20 238 lb (108 kg)  06/11/20 240 lb (108.9 kg)    BP (!) 164/102   Pulse 80   Temp 98.4 F (36.9 C) (Oral)   Ht 6' (1.829 m)   Wt 233 lb (105.7 kg)   SpO2 96%   BMI 31.60 kg/m   Assessment and Plan: 1. Facial cellulitis Local care with warm water cleansing, TAO ointment and loose dressing - sulfamethoxazole-trimethoprim (BACTRIM DS) 800-160 MG tablet; Take 1 tablet by mouth 2 (two) times daily for 10 days.  Dispense: 20 tablet; Refill: 0  2. Primary hypertension Continue Avapro daily - now much more compliant with meds Follow up in 2 weeks   Partially dictated using Dragon software. Any errors are unintentional.  Halina Maidens, MD [facility]  08/23/2020

## 2020-09-02 ENCOUNTER — Ambulatory Visit: Payer: BC Managed Care – PPO | Admitting: Internal Medicine

## 2020-09-04 ENCOUNTER — Ambulatory Visit: Payer: Self-pay | Admitting: *Deleted

## 2020-09-04 ENCOUNTER — Telehealth: Payer: Self-pay | Admitting: Internal Medicine

## 2020-09-04 ENCOUNTER — Other Ambulatory Visit: Payer: Self-pay | Admitting: Internal Medicine

## 2020-09-04 DIAGNOSIS — L03211 Cellulitis of face: Secondary | ICD-10-CM

## 2020-09-04 MED ORDER — SULFAMETHOXAZOLE-TRIMETHOPRIM 800-160 MG PO TABS: 1.0000 | ORAL_TABLET | Freq: Two times a day (BID) | ORAL | 0 refills | Status: AC

## 2020-09-04 NOTE — Telephone Encounter (Signed)
I refilled the antibiotic.

## 2020-09-04 NOTE — Telephone Encounter (Signed)
Patient informed h

## 2020-09-04 NOTE — Telephone Encounter (Signed)
Called patient to review request for Bactrim and review symptoms. No answer, left voicemail to call back to clinic.

## 2020-09-04 NOTE — Telephone Encounter (Unsigned)
Copied from [place] [id number]. Topic: Quick Communication - Rx Refill/Question >> Sep 04, 2020  1:43 PM Tessa Lerner A wrote: Medication: sulfamethoxazole-trimethoprim (BACTRIM DS) 800-160 MG tablet   Has the patient contacted their pharmacy? No. Patient has not contacted their pharmacy because they were uncertain of the medication's refill status  Preferred Pharmacy (with phone number or street name): [place], [place]  Phone:  [phone] Fax:  [phone]  Agent: Please be advised that RX refills may take up to 3 business days. We ask that you follow-up with your pharmacy.

## 2020-09-04 NOTE — Telephone Encounter (Signed)
Patient requesting to take more bactrim DS for his facial swelling. Patient reports he continues to have yellow thin drainage form left face boil. Patient has completed his course of 10 day bactrim 2 days ago. Educated patient medication should still be working to fight infection. Denies fever. Please advise if patient can have another prescription for bactrim.  Care advise given. Patient verbalized understanding of care advise and to call back if symptoms worsen.  Reason for Disposition . Mild face swelling of unknown cause  Answer Assessment - Initial Assessment Questions 1. ONSET: "When did the swelling start?" (e.g., minutes, hours, days)     Greater than 10 days ago  2. LOCATION: "What part of the face is swollen?"     Left face  3. SEVERITY: "How swollen is it?"     "better than it was" 4. ITCHING: "Is there any itching?" If Yes, ask: "How much?"   (Scale 1-10; mild, moderate or severe)     na 5. PAIN: "Is the swelling painful to touch?" If Yes, ask: "How painful is it?"   (Scale 1-10; mild, moderate or severe)   - NONE (0): no pain   - MILD (1-3): doesn't interfere with normal activities    - MODERATE (4-7): interferes with normal activities or awakens from sleep    - SEVERE (8-10): excruciating pain, unable to do any normal activities      na 6. FEVER: "Do you have a fever?" If Yes, ask: "What is it, how was it measured, and when did it start?"      no 7. CAUSE: "What do you think is causing the face swelling?"     Staph infection 8. RECURRENT SYMPTOM: "Have you had face swelling before?" If Yes, ask: "When was the last time?" "What happened that time?"     Recent office visit 08/23/20 9. OTHER SYMPTOMS: "Do you have any other symptoms?" (e.g., toothache, leg swelling)     Facial swelling  10. PREGNANCY: "Is there any chance you are pregnant?" "When was your last menstrual period?"       na  Protocols used: [facility]

## 2020-09-20 ENCOUNTER — Ambulatory Visit: Payer: BC Managed Care – PPO | Admitting: Internal Medicine

## 2020-09-30 ENCOUNTER — Other Ambulatory Visit: Payer: Self-pay | Admitting: Internal Medicine

## 2020-09-30 DIAGNOSIS — I10 Essential (primary) hypertension: Secondary | ICD-10-CM

## 2020-10-22 ENCOUNTER — Encounter: Payer: Self-pay | Admitting: Internal Medicine

## 2020-10-22 ENCOUNTER — Other Ambulatory Visit: Payer: Self-pay

## 2020-10-22 ENCOUNTER — Ambulatory Visit: Payer: BC Managed Care – PPO | Admitting: Internal Medicine

## 2020-10-22 VITALS — BP 180/108 | HR 60 | Temp 98.2°F | Ht 72.0 in | Wt 234.0 lb

## 2020-10-22 DIAGNOSIS — L03211 Cellulitis of face: Secondary | ICD-10-CM | POA: Diagnosis not present

## 2020-10-22 DIAGNOSIS — I10 Essential (primary) hypertension: Secondary | ICD-10-CM | POA: Diagnosis not present

## 2020-10-22 MED ORDER — IRBESARTAN 300 MG PO TABS: 300.0000 mg | ORAL_TABLET | Freq: Every day | ORAL | 1 refills | Status: DC

## 2020-10-22 NOTE — Progress Notes (Signed)
Date:  10/22/2020   Name:  Alexander Davis   DOB:  [birth year]   MRN:  [id number]   Chief Complaint: Hypertension  Hypertension This is a chronic problem. The problem is resistant. Pertinent negatives include no blurred vision, chest pain, headaches, palpitations, peripheral edema or shortness of breath. Past treatments include angiotensin blockers (he has been more compliant with daily medication dosing).   Lab Results  Component Value Date   CREATININE 1.18 11/24/2019   BUN 11 11/24/2019   NA 143 11/24/2019   K 4.2 11/24/2019   CL 99 11/24/2019   CO2 22 11/24/2019   No results found for: CHOL, HDL, LDLCALC, LDLDIRECT, TRIG, CHOLHDL Lab Results  Component Value Date   TSH 1.481 01/19/2017   No results found for: HGBA1C Lab Results  Component Value Date   WBC 6.7 11/12/2019   HGB 12.5 (L) 11/12/2019   HCT 37.9 (L) 11/12/2019   MCV 94.3 11/12/2019   PLT 162 11/12/2019   Lab Results  Component Value Date   ALT 28 01/19/2017   AST 30 01/19/2017   ALKPHOS 110 01/19/2017   BILITOT 0.9 01/19/2017     Review of Systems  Constitutional:  Negative for fatigue and unexpected weight change.  HENT:  Negative for nosebleeds.   Eyes:  Negative for blurred vision and visual disturbance.  Respiratory:  Negative for cough, chest tightness, shortness of breath and wheezing.   Cardiovascular:  Negative for chest pain, palpitations and leg swelling.  Gastrointestinal:  Negative for abdominal pain, constipation and diarrhea.  Neurological:  Negative for dizziness, weakness, light-headedness and headaches.   Patient Active Problem List   Diagnosis Date Noted   Vertigo 11/11/2019   Family history of prostate cancer in father 01/19/2017   Pill dysphagia 11/18/2015   Essential hypertension 02/12/2015    Allergies  Allergen Reactions   Fish Allergy Anaphylaxis   Norvasc [Amlodipine] Swelling   Peanut-Containing Drug Products Anaphylaxis and Swelling    Lips swell    Penicillins Anaphylaxis    Has patient had a PCN reaction causing immediate rash, facial/tongue/throat swelling, SOB or lightheadedness with hypotension: Unknown Has patient had a PCN reaction causing severe rash involving mucus membranes or skin necrosis: Unknown Has patient had a PCN reaction that required hospitalization: Unknown Has patient had a PCN reaction occurring within the last 10 years: No If all of the above answers are "NO", then may proceed with Cephalosporin use.     Hctz [Hydrochlorothiazide] Other (See Comments)    Muscle cramps    Past Surgical History:  Procedure Laterality Date   HERNIA REPAIR     age 49    Social History   Tobacco Use   Smoking status: Never   Smokeless tobacco: Never  Vaping Use   Vaping Use: Never used  Substance Use Topics   Alcohol use: No    Alcohol/week: 0.0 standard drinks   Drug use: No     Medication list has been reviewed and updated.  Current Meds  Medication Sig   Ascorbic Acid (VITAMIN C PO) Take 1 tablet by mouth daily.   irbesartan (AVAPRO) 300 MG tablet Take 1 tablet by mouth once daily   Multiple Vitamins-Minerals (MENS MULTIVITAMIN PO) Take 1 tablet by mouth daily.   Nettle, Urtica Dioica, (NETTLE LEAF PO) Take by mouth.   OLIVE LEAF PO Take by mouth.   OVER THE COUNTER MEDICATION Take 1 Dose by mouth daily. Colloidal Silver liquid    PHQ 2/9 Scores 08/23/2020  07/29/2020 06/11/2020 11/24/2019  PHQ - 2 Score 2 1 0 1  PHQ- 9 Score 2 1 0 1    GAD 7 : Generalized Anxiety Score 08/23/2020 07/29/2020 06/11/2020 11/24/2019  Nervous, Anxious, on Edge 2 1 0 0  Control/stop worrying 1 1 0 0  Worry too much - different things 1 1 0 0  Trouble relaxing 0 0 0 0  Restless 0 0 0 0  Easily annoyed or irritable 1 0 0 0  Afraid - awful might happen 0 0 0 0  Total GAD 7 Score 5 3 0 0  Anxiety Difficulty Somewhat difficult - Not difficult at all -    BP Readings from Last 3 Encounters:  10/22/20 (!) 180/108  08/23/20 (!) 164/102   07/29/20 (!) 142/118    Physical Exam Vitals and nursing note reviewed.  Constitutional:      General: He is not in acute distress.    Appearance: He is well-developed.  HENT:     Head: Normocephalic and atraumatic.      Comments: Firm cystic mass without drainage 0.5 x 0.5 cm Pulmonary:     Effort: Pulmonary effort is normal. No respiratory distress.  Skin:    General: Skin is warm and dry.     Findings: No rash.  Neurological:     Mental Status: He is alert and oriented to person, place, and time.  Psychiatric:        Mood and Affect: Mood normal.        Behavior: Behavior normal.    Wt Readings from Last 3 Encounters:  10/22/20 234 lb (106.1 kg)  08/23/20 233 lb (105.7 kg)  07/29/20 238 lb (108 kg)    BP (!) 180/108   Pulse 60   Temp 98.2 F (36.8 C) (Oral)   Ht 6' (1.829 m)   Wt 234 lb (106.1 kg)   SpO2 98%   BMI 31.74 kg/m   Assessment and Plan: 1. Essential hypertension BP still not controlled with medication adherence and exercise Pt agrees to be referred - Ambulatory referral to [facility] [place]  2. Facial cellulitis - Ambulatory referral to Dermatology   Partially dictated using Dragon software. Any errors are unintentional.  Halina Maidens, MD [facility]  10/22/2020

## 2021-01-02 ENCOUNTER — Other Ambulatory Visit: Payer: Self-pay | Admitting: Internal Medicine

## 2021-01-02 DIAGNOSIS — I10 Essential (primary) hypertension: Secondary | ICD-10-CM

## 2021-01-03 ENCOUNTER — Ambulatory Visit: Payer: Self-pay | Admitting: *Deleted

## 2021-01-03 NOTE — Telephone Encounter (Signed)
I returned pt's call.   He had called in earlier about possibly taking another BP medication he had left over while waiting for his current BP medication to be refilled.  I let him know the Avapro 300 mg was approved for a #30 day refill and to check with his pharmacy this evening.   He verbalized understanding.  See triage notes

## 2021-01-03 NOTE — Telephone Encounter (Signed)
Reason for Disposition  [1] Prescription prescribed recently is not at pharmacy AND [2] triager has access to patient's EMR AND [3] prescription is recorded in the EMR  Answer Assessment - Initial Assessment Questions 1. NAME of MEDICATION: "What medicine are you calling about?"     Avapro 300 mg  2. QUESTION: "What is your question?" (e.g., double dose of medicine, side effect)     I was calling to find out if it was refilled or not.  Dr. Army Melia had referred me to [facility] but my appt isn't until Oct. 3. PRESCRIBING HCP: "Who prescribed it?" Reason: if prescribed by specialist, call should be referred to that group.     Dr. Halina Maidens. I let pt know a 30 day refill was approved today about 1:00 so his pharmacy should have it ready for him this afternoon. 4. SYMPTOMS: "Do you have any symptoms?"     N/A 5. SEVERITY: If symptoms are present, ask "Are they mild, moderate or severe?"     N/A 6. PREGNANCY:  "Is there any chance that you are pregnant?" "When was your last menstrual period?"     N/A  Protocols used: Medication Question Call-[facility]

## 2021-01-03 NOTE — Telephone Encounter (Signed)
Requested medications are due for refill today yes  Requested medications are on the active medication list yes  Last refill 8/2  Last visit 6/28  Future visit scheduled no  Notes to clinic last OV note states pt will be seen at a HTN clinic, no further visits scheduled with [facility], please assess.

## 2021-01-09 ENCOUNTER — Encounter (HOSPITAL_BASED_OUTPATIENT_CLINIC_OR_DEPARTMENT_OTHER): Payer: Self-pay

## 2021-01-09 ENCOUNTER — Ambulatory Visit (HOSPITAL_BASED_OUTPATIENT_CLINIC_OR_DEPARTMENT_OTHER): Payer: BC Managed Care – PPO | Admitting: Cardiovascular Disease

## 2021-01-27 ENCOUNTER — Other Ambulatory Visit: Payer: Self-pay | Admitting: Internal Medicine

## 2021-02-06 ENCOUNTER — Other Ambulatory Visit: Payer: Self-pay | Admitting: Internal Medicine

## 2021-02-06 DIAGNOSIS — I10 Essential (primary) hypertension: Secondary | ICD-10-CM

## 2021-02-07 ENCOUNTER — Other Ambulatory Visit: Payer: Self-pay

## 2021-02-07 DIAGNOSIS — I10 Essential (primary) hypertension: Secondary | ICD-10-CM

## 2021-02-07 NOTE — Telephone Encounter (Signed)
Pt states the appointment with his cardiologist is not until 03/11/21. Pt is out of his bp med and needs today please. irbesartan (AVAPRO) 300 MG tablet  [place], [place]

## 2021-02-07 NOTE — Telephone Encounter (Signed)
LMOVM that his bp medication will be sent in fr 30 days.  Pt needs to make sure he keeps his appointment with heartcare on 03/11/21

## 2021-02-07 NOTE — Telephone Encounter (Signed)
Requested medication (s) are due for refill today: Yes  Requested medication (s) are on the active medication list: Yes  Last refill:  01/03/21  Future visit scheduled: No  Notes to clinic:  Unable to refill per protocol due to failed labs, no updated results.      Requested Prescriptions  Pending Prescriptions Disp Refills   irbesartan (AVAPRO) 300 MG tablet [Pharmacy Med Name: Irbesartan 300 MG Oral Tablet] 30 tablet 0    Sig: Take 1 tablet by mouth once daily     Cardiovascular:  Angiotensin Receptor Blockers Failed - 02/06/2021  5:57 PM      Failed - Cr in normal range and within 180 days    Creatinine, Ser  Date Value Ref Range Status  11/24/2019 1.18 0.76 - 1.27 mg/dL Final          Failed - K in normal range and within 180 days    Potassium  Date Value Ref Range Status  11/24/2019 4.2 3.5 - 5.2 mmol/L Final          Failed - Last BP in normal range    BP Readings from Last 1 Encounters:  10/22/20 (!) 180/108          Passed - Patient is not pregnant      Passed - Valid encounter within last 6 months    Recent Outpatient Visits           3 months ago Essential hypertension   [facility] Glean Hess, MD   5 months ago Facial cellulitis   [place], Laura H, MD   6 months ago Primary hypertension   [place], Laura H, MD   8 months ago Primary hypertension   [facility] Glean Hess, MD   1 year ago Essential hypertension   [place], Laura H, MD       Future Appointments             In 1 month Skeet Latch, MD [facility], [place]   In 1 month Ralene Bathe, MD [place]

## 2021-03-10 NOTE — Progress Notes (Signed)
Advanced Hypertension Clinic Initial Assessment:    Date:  03/11/2021   ID:  Alexander Davis, DOB 08-25-1960, MRN [id number]  PCP:  Alexander Hess, MD  Cardiologist:  None  Nephrologist:  Referring MD: Alexander Hess, MD   CC: Hypertension  History of Present Illness:    Alexander Davis is a 60 y.o. male with a hx of hypertension and asthma here to establish care in the [facility].  He last saw his PCP, Alexander Davis, on 10/22/20 for hypertension. At that time he was on irbesartan 300mg  daily and his blood pressure was 180/108.  He was referred to the [facility].  Today, he is doing well. He recalls having high blood pressure since around 1998/07/25, starting when he was in graduate school in [place]. He once came home and became severely dizzy which caused him to visit the ER. One year ago, he reported episodes of vertigo. Recently, he has had similar experiences with dizziness. He checks his blood pressure at home and reports most recently it was 139/94. He believes he may have white coat syndrome. He sometimes feels pain in his L knee which he attributes to physical over-exertion while exercising. His father passed away in July 24, 2013 due to cancer but also suffered from hypertension and stroke. His older brother and older sister both had hypertension and both of his older sisters passed away. His mother has a history of anemia.   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 The decision to stay in [place] or move has been causing him stress. He is originally from [place] and has trouble feeling comfortable in [place] because of the differential treatment of Black men. His blood pressure complications and life events like the passing of his sisters have also caused him stress.  He tries talking with his wife and 2 good friends about his stress and situations. The stress can cause him to lose sleep and further  frustration because he feels nobody can fully understand the situation he is in. For his diet, he does not consume caffeine or alcohol and opts to cook at home. However, he enjoys adding salt to his food. He used to exercise 4-5 days a week but this has been reduced to 1-2 days lately. He enjoys walking 2 miles and strength exercises like push-ups and light weights. While exercising, his weight stayed around 190-200 lbs.  He initially denied CP/SOB with exertion, but prior to leaving noted that he does sometimes have exertional chest discomfort and dyspnea.  He denies any palpitations, headaches, syncope, orthopnea, PND, or lower extremity edema.  Previous antihypertensives: HCTZ - bilateral ankle edema, fatigue Amlodipine  Past Medical History:  Diagnosis Date   Asthma    As child    Hypertension    Hypertensive urgency 11/11/2019   Obesity 03/11/2021   Precordial chest pain 03/11/2021    Past Surgical History:  Procedure Laterality Date   HERNIA REPAIR     age 25    Current Medications: Current Meds  Medication Sig   Ascorbic Acid (VITAMIN C PO) Take 1 tablet by mouth daily.   irbesartan (AVAPRO) 300 MG tablet Take 1 tablet by mouth once daily   Multiple Vitamins-Minerals (MENS MULTIVITAMIN PO) Take 1 tablet by mouth daily.   Nettle, Urtica Dioica, (NETTLE LEAF PO) Take by mouth.   OLIVE LEAF PO Take by mouth.   spironolactone (ALDACTONE) 25 MG tablet Take 1 tablet (  25 mg total) by mouth daily.   [DISCONTINUED] metoprolol tartrate (LOPRESSOR) 25 MG tablet Take 1 tablet (25 mg total) by mouth 2 (two) times daily.     Allergies:   Fish allergy, Norvasc [amlodipine], Peanut-containing drug products, Penicillins, and Hctz [hydrochlorothiazide]   Social History   Socioeconomic History   Marital status: Married    Spouse name: Not on file   Number of children: Not on file   Years of education: Not on file   Highest education level: Not on file  Occupational History   Not on  file  Tobacco Use   Smoking status: Never   Smokeless tobacco: Never  Vaping Use   Vaping Use: Never used  Substance and Sexual Activity   Alcohol use: No    Alcohol/week: 0.0 standard drinks   Drug use: No   Sexual activity: Not on file  Other Topics Concern   Not on file  Social History Narrative   Not on file   Social Determinants of Health   Financial Resource Strain: Low Risk    Difficulty of Paying Living Expenses: Not hard at all  Food Insecurity: No Food Insecurity   Worried About Charity fundraiser in the Last Year: Never true   Ran Out of Food in the Last Year: Never true  Transportation Needs: No Transportation Needs   Lack of Transportation (Medical): No   Lack of Transportation (Non-Medical): No  Physical Activity: Insufficiently Active   Days of Exercise per Week: 1 day   Minutes of Exercise per Session: 50 min  Stress: Not on file  Social Connections: Not on file     Family History: The patient's family history includes Anemia in his mother; Breast cancer in his sister; Hypertension in his brother, father, sister, and sister; Prostate cancer (age of onset: 17) in his father; Stroke in his father.  ROS:   Please see the history of present illness.   (+) Stress  (+) L Knee Pain All other systems reviewed and are negative.  EKGs/Labs/Other Studies Reviewed:    EKG:   03/11/21: Sinus bradycardia, rate 54 bpm; lateral T-wave inversions  Recent Labs: No results found for requested labs within last 8760 hours.   Recent Lipid Panel No results found for: CHOL, TRIG, HDL, CHOLHDL, VLDL, LDLCALC, LDLDIRECT  Physical Exam:   VS:  BP (!) 188/94 (BP Location: Right Arm, Patient Position: Sitting, Cuff Size: Large)   Pulse (!) 54   Ht 6' (1.829 m)   Wt 238 lb (108 kg)   BMI 32.28 kg/m  , BMI Body mass index is 32.28 kg/m. GENERAL:  Well appearing HEENT: Pupils equal round and reactive, fundi not visualized, oral mucosa unremarkable NECK:  No jugular  venous distention, waveform within normal limits, carotid upstroke brisk and symmetric, no bruits, no thyromegaly LUNGS:  Clear to auscultation bilaterally HEART: Bradycardic.  Regular rhythm.  PMI not displaced or sustained,S1 and S2 within normal limits, no S3, no S4, no clicks, no rubs, no murmurs ABD:  Flat, positive bowel sounds normal in frequency in pitch, no bruits, no rebound, no guarding, no midline pulsatile mass, no hepatomegaly, no splenomegaly EXT:  2 plus pulses throughout, no edema, no cyanosis no clubbing SKIN:  No rashes no nodules NEURO:  Cranial nerves II through XII grossly intact, motor grossly intact throughout PSYCH:  Cognitively intact, oriented to person place and time   ASSESSMENT/PLAN:    Essential hypertension Blood pressure has been poorly controlled.  He hasn't tolerated HCTZ  or amlodipine.  He hasn't been exercising as much lately and is under a lot of stress.  He struggles with salt.  Avoid beta blockers due to bradycardia. BP is elevated at home and in the office.  Though it is much higher in the office than at home.  I suspect that he has whitecoat hypertension superimposed on essential hypertension.  He is interested in the PREP program.  He will work on increasing his exercise and being consistent with at least 150 minutes weekly.  We will have our Care Guide reach out to him for stress management.  We will check TSH.  We will add spironolactone 25 mg daily and check lipids and comprehensive metabolic panel in a week.  Precordial chest pain He has intermittent chest discomfort with exercise.  He has poorly controlled high blood pressure.  We will get a coronary CTA to evaluate for obstructive coronary disease.  Also check fasting lipids and a CMP.   Obesity Continue working on diet and exercise.  Refer to PREP.   Screening for Secondary Hypertension:     Relevant Labs/Studies: Basic Labs Latest Ref Rng & Units 11/24/2019 11/13/2019 11/12/2019  Sodium 134 -  144 mmol/L 143 137 142  Potassium 3.5 - 5.2 mmol/L 4.2 3.4(L) 3.4(L)  Creatinine 0.76 - 1.27 mg/dL 1.18 1.11 1.13    Thyroid  Latest Ref Rng & Units 01/19/2017  TSH 0.350 - 4.500 uIU/mL 1.481                 Disposition:    FU with MD/PharmD in 1 month  FU with Akyla Vavrek C. Oval Linsey, MD, [facility] in 4 months   Medication Adjustments/Labs and Tests Ordered: Current medicines are reviewed at length with the patient today.  Concerns regarding medicines are outlined above.  Orders Placed This Encounter  Procedures   CT CORONARY MORPH W/CTA COR W/SCORE W/CA W/CM &/OR WO/CM   TSH   Lipid panel   Comprehensive metabolic panel   Amb Referral To Provider Referral Exercise Program (P.R.E.P)   EKG 12-Lead    Meds ordered this encounter  Medications   spironolactone (ALDACTONE) 25 MG tablet    Sig: Take 1 tablet (25 mg total) by mouth daily.    Dispense:  90 tablet    Refill:  1   DISCONTD: metoprolol tartrate (LOPRESSOR) 25 MG tablet    Sig: Take 1 tablet (25 mg total) by mouth 2 (two) times daily.    Dispense:  180 tablet    Refill:  3   metoprolol tartrate (LOPRESSOR) 25 MG tablet    Sig: TAKE 1 TABLET 2 HOURS PRIOR TO CT    Dispense:  1 tablet    Refill:  0    D/C PREVIOUS RX    I,Mykaella Javier,acting as a scribe for Skeet Latch, MD.,have documented all relevant documentation on the behalf of Skeet Latch, MD,as directed by  Skeet Latch, MD while in the presence of Skeet Latch, MD.  I, [place] Oval Linsey, MD have reviewed all documentation for this visit.  The documentation of the exam, diagnosis, procedures, and orders on 03/11/2021 are all accurate and complete.   Signed, Skeet Latch, MD  03/11/2021 12:56 PM    [place]

## 2021-03-11 ENCOUNTER — Ambulatory Visit (HOSPITAL_BASED_OUTPATIENT_CLINIC_OR_DEPARTMENT_OTHER): Payer: BC Managed Care – PPO | Admitting: Cardiovascular Disease

## 2021-03-11 ENCOUNTER — Encounter (HOSPITAL_BASED_OUTPATIENT_CLINIC_OR_DEPARTMENT_OTHER): Payer: Self-pay | Admitting: Cardiovascular Disease

## 2021-03-11 ENCOUNTER — Other Ambulatory Visit: Payer: Self-pay

## 2021-03-11 VITALS — BP 188/94 | HR 54 | Ht 72.0 in | Wt 238.0 lb

## 2021-03-11 DIAGNOSIS — E669 Obesity, unspecified: Secondary | ICD-10-CM

## 2021-03-11 DIAGNOSIS — R072 Precordial pain: Secondary | ICD-10-CM

## 2021-03-11 DIAGNOSIS — R0609 Other forms of dyspnea: Secondary | ICD-10-CM

## 2021-03-11 DIAGNOSIS — Z6832 Body mass index (BMI) 32.0-32.9, adult: Secondary | ICD-10-CM

## 2021-03-11 DIAGNOSIS — E66811 Obesity, class 1: Secondary | ICD-10-CM

## 2021-03-11 DIAGNOSIS — I10 Essential (primary) hypertension: Secondary | ICD-10-CM

## 2021-03-11 DIAGNOSIS — R079 Chest pain, unspecified: Secondary | ICD-10-CM

## 2021-03-11 DIAGNOSIS — E6609 Other obesity due to excess calories: Secondary | ICD-10-CM

## 2021-03-11 HISTORY — DX: Obesity, unspecified: E66.9

## 2021-03-11 HISTORY — DX: Precordial pain: R07.2

## 2021-03-11 MED ORDER — METOPROLOL TARTRATE 25 MG PO TABS: 25.0000 mg | ORAL_TABLET | Freq: Two times a day (BID) | ORAL | 3 refills | Status: DC

## 2021-03-11 MED ORDER — SPIRONOLACTONE 25 MG PO TABS: 25.0000 mg | ORAL_TABLET | Freq: Every day | ORAL | 1 refills | Status: DC

## 2021-03-11 MED ORDER — METOPROLOL TARTRATE 25 MG PO TABS: ORAL_TABLET | ORAL | 0 refills | Status: DC

## 2021-03-11 NOTE — Assessment & Plan Note (Addendum)
He has intermittent chest discomfort with exercise.  He has poorly controlled high blood pressure.  We will get a coronary CTA to evaluate for obstructive coronary disease.  Also check fasting lipids and a CMP.

## 2021-03-11 NOTE — Patient Instructions (Addendum)
Medication Instructions:  START SPIRONOLACTONE 25 MG DAILY   TAKE METOPROLOL 25 MG 1 TABLET 2 HOURS PRIOR TO CARDIAC CT    Labwork: FASTING LP/CMET/TSH IN 1 WEEK    Testing/Procedures: Your physician has requested that you have cardiac CT. Cardiac computed tomography (CT) is a painless test that uses an x-ray machine to take clear, detailed pictures of your heart. For further information please visit [email or web address]. Please follow instruction sheet as given. THE OFFICE WILL CALL YOU TO SCHEDULE ONCE YOUR INSURANCE HAS BEEN REVIEWED    Follow-Up: 04/14/2021 10:00 AM WITH PHARM D AT [place] [address]    You will receive a phone call from the PREP exercise and nutrition program to schedule an initial assessment.   Special Instructions:   MONITOR YOUR BLOOD PRESSURE TWICE A DAY, LOG IN THE BOOK PROVIDED. BRING THE BOOK AND YOUR BLOOD PRESSURE MACHINE TO YOUR FOLLOW UP IN 1 MONTH     Your cardiac CT will be scheduled at one of the below locations:   [facility] [address] [place], [place] [address] [phone]  [place] [address] [place], [place] [address] [phone]  If scheduled at [facility], please arrive at the [facility] main entrance (entrance A) of [facility] 30 minutes prior to test start time. You can use the FREE valet parking offered at the main entrance (encouraged to control the heart rate for the test) Proceed to the [facility] Radiology Department (first floor) to check-in and test prep.  If scheduled at [facility], please arrive 15 mins early for check-in and test prep.  Please follow these instructions carefully (unless otherwise directed):  Hold all erectile dysfunction medications at least 3 days (72 hrs) prior to test.  On the Night Before the Test: Be sure to Drink plenty of water. Do not consume any  caffeinated/decaffeinated beverages or chocolate 12 hours prior to your test. Do not take any antihistamines 12 hours prior to your test. If the patient has contrast allergy: Patient will need a prescription for Prednisone and very clear instructions (as follows): Prednisone 50 mg - take 13 hours prior to test Take another Prednisone 50 mg 7 hours prior to test Take another Prednisone 50 mg 1 hour prior to test Take Benadryl 50 mg 1 hour prior to test Patient must complete all four doses of above prophylactic medications. Patient will need a ride after test due to Benadryl.  On the Day of the Test: Drink plenty of water until 1 hour prior to the test. Do not eat any food 4 hours prior to the test. You may take your regular medications prior to the test.  Take metoprolol (Lopressor) two hours prior to test. HOLD Furosemide/Hydrochlorothiazide morning of the test. FEMALES- please wear underwire-free bra if available, avoid dresses & tight clothing      After the Test: Drink plenty of water. After receiving IV contrast, you may experience a mild flushed feeling. This is normal. On occasion, you may experience a mild rash up to 24 hours after the test. This is not dangerous. If this occurs, you can take Benadryl 25 mg and increase your fluid intake. If you experience trouble breathing, this can be serious. If it is severe call 911 IMMEDIATELY. If it is mild, please call our office. If you take any of these medications: Glipizide/Metformin, Avandament, Glucavance, please do not take 48 hours after completing test unless  otherwise instructed.  Please allow 2-4 weeks for scheduling of routine cardiac CTs. Some insurance companies require a pre-authorization which may delay scheduling of this test.   For non-scheduling related questions, please contact the cardiac imaging nurse navigator should you have any questions/concerns: Marchia Bond, Cardiac Imaging Nurse Navigator Gordy Clement,  Cardiac Imaging Nurse Navigator [facility] Direct Office Dial: [phone]   For scheduling needs, including cancellations and rescheduling, please call [place], [phone].  Cardiac CT Angiogram A cardiac CT angiogram is a procedure to look at the heart and the area around the heart. It may be done to help find the cause of chest pains or other symptoms of heart disease. During this procedure, a substance called contrast dye is injected into the blood vessels in the area to be checked. A large X-ray machine, called a CT scanner, then takes detailed pictures of the heart and the surrounding area. The procedure is also sometimes called a coronary CT angiogram, coronary artery scanning, or CTA. A cardiac CT angiogram allows the health care provider to see how well blood is flowing to and from the heart. The health care provider will be able to see if there are any problems, such as: Blockage or narrowing of the coronary arteries in the heart. Fluid around the heart. Signs of weakness or disease in the muscles, valves, and tissues of the heart. Tell a health care provider about: Any allergies you have. This is especially important if you have had a previous allergic reaction to contrast dye. All medicines you are taking, including vitamins, herbs, eye drops, creams, and over-the-counter medicines. Any blood disorders you have. Any surgeries you have had. Any medical conditions you have. Whether you are pregnant or may be pregnant. Any anxiety disorders, chronic pain, or other conditions you have that may increase your stress or prevent you from lying still. What are the risks? Generally, this is a safe procedure. However, problems may occur, including: Bleeding. Infection. Allergic reactions to medicines or dyes. Damage to other structures or organs. Kidney damage from the contrast dye that is used. Increased risk of cancer from radiation exposure. This risk is  low. Talk with your health care provider about: The risks and benefits of testing. How you can receive the lowest dose of radiation. What happens before the procedure? Wear comfortable clothing and remove any jewelry, glasses, dentures, and hearing aids. Follow instructions from your health care provider about eating and drinking. This may include: For 12 hours before the procedure -- avoid caffeine. This includes tea, coffee, soda, energy drinks, and diet pills. Drink plenty of water or other fluids that do not have caffeine in them. Being well hydrated can prevent complications. For 4-6 hours before the procedure -- stop eating and drinking. The contrast dye can cause nausea, but this is less likely if your stomach is empty. Ask your health care provider about changing or stopping your regular medicines. This is especially important if you are taking diabetes medicines, blood thinners, or medicines to treat problems with erections (erectile dysfunction). What happens during the procedure?  Hair on your chest may need to be removed so that small sticky patches called electrodes can be placed on your chest. These will transmit information that helps to monitor your heart during the procedure. An IV will be inserted into one of your veins. You might be given a medicine to control your heart rate during the procedure. This will help to ensure that good images are obtained. You  will be asked to lie on an exam table. This table will slide in and out of the CT machine during the procedure. Contrast dye will be injected into the IV. You might feel warm, or you may get a metallic taste in your mouth. You will be given a medicine called nitroglycerin. This will relax or dilate the arteries in your heart. The table that you are lying on will move into the CT machine tunnel for the scan. The person running the machine will give you instructions while the scans are being done. You may be asked to: Keep your  arms above your head. Hold your breath. Stay very still, even if the table is moving. When the scanning is complete, you will be moved out of the machine. The IV will be removed. The procedure may vary among health care providers and hospitals. What can I expect after the procedure? After your procedure, it is common to have: A metallic taste in your mouth from the contrast dye. A feeling of warmth. A headache from the nitroglycerin. Follow these instructions at home: Take over-the-counter and prescription medicines only as told by your health care provider. If you are told, drink enough fluid to keep your urine pale yellow. This will help to flush the contrast dye out of your body. Most people can return to their normal activities right after the procedure. Ask your health care provider what activities are safe for you. It is up to you to get the results of your procedure. Ask your health care provider, or the department that is doing the procedure, when your results will be ready. Keep all follow-up visits as told by your health care provider. This is important. Contact a health care provider if: You have any symptoms of allergy to the contrast dye. These include: Shortness of breath. Rash or hives. A racing heartbeat. Summary A cardiac CT angiogram is a procedure to look at the heart and the area around the heart. It may be done to help find the cause of chest pains or other symptoms of heart disease. During this procedure, a large X-ray machine, called a CT scanner, takes detailed pictures of the heart and the surrounding area after a contrast dye has been injected into blood vessels in the area. Ask your health care provider about changing or stopping your regular medicines before the procedure. This is especially important if you are taking diabetes medicines, blood thinners, or medicines to treat erectile dysfunction. If you are told, drink enough fluid to keep your urine pale  yellow. This will help to flush the contrast dye out of your body. This information is not intended to replace advice given to you by your health care provider. Make sure you discuss any questions you have with your health care provider. Document Revised: 12/25/2020 Document Reviewed: 12/07/2018 Elsevier Patient Education  2022 [employer].

## 2021-03-11 NOTE — Assessment & Plan Note (Addendum)
Blood pressure has been poorly controlled.  He hasn't tolerated HCTZ or amlodipine.  He hasn't been exercising as much lately and is under a lot of stress.  He struggles with salt.  Avoid beta blockers due to bradycardia. BP is elevated at home and in the office.  Though it is much higher in the office than at home.  I suspect that he has whitecoat hypertension superimposed on essential hypertension.  He is interested in the PREP program.  He will work on increasing his exercise and being consistent with at least 150 minutes weekly.  We will have our Care Guide reach out to him for stress management.  We will check TSH.  We will add spironolactone 25 mg daily and check lipids and comprehensive metabolic panel in a week.

## 2021-03-11 NOTE — Assessment & Plan Note (Signed)
Continue working on diet and exercise.  Refer to PREP.

## 2021-03-13 ENCOUNTER — Telehealth: Payer: Self-pay

## 2021-03-13 DIAGNOSIS — Z0000 Encounter for general adult medical examination without abnormal findings: Secondary | ICD-10-CM

## 2021-03-13 NOTE — Telephone Encounter (Signed)
Called patient to discuss their interest in health coaching for stress management per referral from Dr. Oval Linsey. Patient did not answer. Left message for patient to return call.   Eathen Budreau Truman Hayward, [facility] Guide, Health Coach [address], [address] [place] [address] Telephone: [phone] Email: [email or web address]

## 2021-03-14 ENCOUNTER — Telehealth: Payer: Self-pay

## 2021-03-14 NOTE — Telephone Encounter (Signed)
Vmt pt requesting call back to discuss PREP classes at the [facility]

## 2021-03-18 ENCOUNTER — Telehealth: Payer: Self-pay

## 2021-03-18 DIAGNOSIS — Z0000 Encounter for general adult medical examination without abnormal findings: Secondary | ICD-10-CM

## 2021-03-18 NOTE — Telephone Encounter (Signed)
Patient returned call and stated that he is interested in health coaching for stress management. Patient provided background information on his stressor. Due to the nature of the patient's stressor, patient was offered to be referred to a therapist but was concerned about cost and wanted to try health coaching first. Patient has been scheduled for his initial health coaching session on 03/24/21 at 11:15am.   Alexander Davis, Alexander Davis, Health Coach [address], [address] [place] [address] Telephone: [phone] Email: [email or web address]

## 2021-03-18 NOTE — Telephone Encounter (Signed)
Returned patient's call regarding health coaching for stress management per Dr. Oval Linsey. Patient did not answer left a message for patient to return call.    Alexander Davis Alexander Davis, [facility] Guide, Health Coach [address], [address] [place] [address] Telephone: [phone] Email: [email or web address]

## 2021-03-24 ENCOUNTER — Other Ambulatory Visit: Payer: Self-pay

## 2021-03-24 ENCOUNTER — Ambulatory Visit (INDEPENDENT_AMBULATORY_CARE_PROVIDER_SITE_OTHER): Payer: BC Managed Care – PPO

## 2021-03-24 DIAGNOSIS — Z0000 Encounter for general adult medical examination without abnormal findings: Secondary | ICD-10-CM

## 2021-03-24 NOTE — Progress Notes (Signed)
Appointment Outcome: Completed, Session #: Initial health coaching session Start time: 11:16am   End time: 12:24pm   Total Mins: 68 minutes  AGREEMENTS SECTION   Overall Goal(s): Stress management  Agreement/Action Steps Walking 2 miles in neighborhood or exercising in clubhouse Read scriptures in the morning Conducting self-check-ins  Implementing positive self-talk Writing in journal 1-2xs/week Set alarm 9:30-10:00pm  Progress Notes:  Patient shared that there has been a reduction in communication and interaction between him and his family members. Patient stated that he has made changes in his life over the past few years and does not feel received by his family because of these changes. Patient stated that he has been working on his relationship with God and no longer behave the way that he use dto. Patient has solidified his career and is married. Patient's wife is a part of his support system in addition to two other friends.   Patient stated that he does talk to himself positive. Patient uses scriptures as IT sales professional and to meditate upon throughout the day. Patient shared that he does not talk negative about his family members. Patient struggles with how certain behaviors of his siblings is seen as acceptable but he cannot leave them out of his plans. Patient stated that prior to positive self-talk he was blaming himself and wondering what he had done wrong. Patient has tried to have conversations with his family members to no avail.   Patient feels that his family has a lack of compassion for him, is often left out of things, never get honest and upfront answers. Patient stated that he has put a buffer between him and his family members to help avoid conflict.   Patient shared that he reads scriptures in the morning to get his mind focused. Patient also finds himself walking in the evenings for approximately 2 miles around his neighborhood. Patient stated that if the weather does  not permit, he goes to the clubhouse to exercise. Patient has made some changes to his diet to include more fruit and juicing, while stopping comfort foods. Patient mentioned that he started this routine about a year ago. Patient stated that it is important that he pays to mental, physical, and spiritual ways that he copes with stress. Patient feels that when he does not engage in these strategies to manage stress, things get off track.      Coaching Outcomes: Patient is interested in writing in a journal as a stress management strategy. Patient discussed different ways that he can write in his journal. Patient was informed that he can write about what is causing him stress, things that he is grateful for, or things that he has accomplished during the day as a way to reframe his mind. Patient stated that he will set an alarm around 9:30-10:00pm to write at least 1-2xs/week during a wind down time before bed.   Patient will continue to utilize his wife and two friends as a support system.   Patient will continue walking, reading scriptures in the morning, conducting self-check-ins, and implementing positive self-talk.  Patient was emailed a copy of the Welcome Letter and Code of Ethics for his review, along with his action steps for the next two weeks.

## 2021-04-03 ENCOUNTER — Ambulatory Visit: Payer: BC Managed Care – PPO | Admitting: Dermatology

## 2021-04-08 ENCOUNTER — Ambulatory Visit: Payer: BC Managed Care – PPO

## 2021-04-14 ENCOUNTER — Telehealth: Payer: Self-pay

## 2021-04-14 ENCOUNTER — Ambulatory Visit: Payer: BC Managed Care – PPO

## 2021-04-14 DIAGNOSIS — Z0000 Encounter for general adult medical examination without abnormal findings: Secondary | ICD-10-CM

## 2021-04-14 NOTE — Telephone Encounter (Signed)
Patient returned call to reschedule health coaching session. Patient has been scheduled for 12/20 at 12:45pm. Patient will be called during this time.    Martyn Timme Truman Hayward, [facility] Guide, Health Coach [address], [address] [place] [address] Telephone: [phone] Email: [email or web address]

## 2021-04-14 NOTE — Telephone Encounter (Signed)
Returned patient's call regarding rescheduling health coaching session. Left message for patient to return call.    Alexander Davis Truman Hayward, [facility] Guide, Health Coach [address] [place] [address] Telephone: [phone] Email: [email or web address]

## 2021-04-15 ENCOUNTER — Telehealth: Payer: Self-pay

## 2021-04-15 ENCOUNTER — Other Ambulatory Visit: Payer: Self-pay

## 2021-04-15 ENCOUNTER — Ambulatory Visit (INDEPENDENT_AMBULATORY_CARE_PROVIDER_SITE_OTHER): Payer: BC Managed Care – PPO

## 2021-04-15 DIAGNOSIS — Z0000 Encounter for general adult medical examination without abnormal findings: Secondary | ICD-10-CM

## 2021-04-15 NOTE — Telephone Encounter (Signed)
Called patient as scheduled for health coaching session. Patient did not answer. Left patient a message to return call to hold session or to reschedule.  Maryem Shuffler Truman Hayward, [facility] Guide, Health Coach [address], [address] [place] [address] Telephone: [phone] Email: [email or web address]

## 2021-04-15 NOTE — Progress Notes (Signed)
Appointment Outcome: Completed, Session #: 1 Start time: 1:05pm   End time: 1:34pm   Total Mins: 29 minutes  AGREEMENTS SECTION   Overall Goal(s): Stress management   Agreement/Action Steps Walking 2 miles in neighborhood or exercising in clubhouse Read scriptures in the morning Conducting self-check-ins  Implementing positive self-talk Writing in journal 1-2xs/week Set alarm 9:30-10:00pm  Progress Notes:  Patient stated that he has been writing in his journal every night before bed or while in bed. Patient stated that he reflects on what he writes, how he feel/think about it, and talk to God to gain clarity on what he is dealing with. Patient stated that he looks forward to this new evening routine and being able to spend time with God. Patient continues to read various scriptures in the morning to set the tone for the day.   Patient stated that he has removed himself from some relationships he finds unhealthy. Patient stated that severing ties with some people has reduced his stress because he does not have to deal drama. Patient stated that he used to be controlled by how others felt and thought about him, but he is no longer in this space.   Patient mentioned that his biggest challenge remains not having healthy relationships with his siblings. Patient stated this is his major source of stress. Patient stated that even with family, he has had to separate himself from them because they engage in behaviors /traditions that he no longer participates in or they do things that use to bother him. Patient reported that he is working on not being moved by behaviors of others, which will be a major adjustment during Christmas.   Patient stated that he has cleaned up diet more since last session. Patient stated that he is losing weight around his midsection. Patient shared that he is working out in the clubhouse since the weather has changed. Patient reported that he feels better and is more alert  with exercising and working on getting back into shape. Patient shared that people are noticing and believes that his progress is an inspiration to others.   Patient shared that he feels free and independent after making these changes and implementing steps towards stress management.    Indicators of Success and Accountability:  Patient stated that writing in the journal, reflecting and gaining clarity are his indicators of success and accountability.  Readiness: Patient is in the action phase of stress management. Strengths and Supports: Patient is being supported by his wife. Patient has been relying on his faith and he is reflective/analytical. Challenges and Barriers: Patient does not foresee any challenges to implementing his steps over the next two weeks.   Coaching Outcomes: Patient stated that his way of thinking has transformed. Patient feels that he is in a space that he is no longer controlled by how others feel and think about him.  Patient is now engaged in behaviors and activities to improve himself and stated that he is doing this for himself and not for others.   Patient will continue to implement his action steps as outlined above over the next two weeks.    Attempted: Fulfilled - Patient completed the weekly agreement in full and was able to meet the challenge.

## 2021-04-19 ENCOUNTER — Emergency Department (HOSPITAL_BASED_OUTPATIENT_CLINIC_OR_DEPARTMENT_OTHER)
Admission: EM | Admit: 2021-04-19 | Discharge: 2021-04-19 | Disposition: A | Discharge status: Home / Self Care | Payer: BC Managed Care – PPO | Attending: Student | Admitting: Student

## 2021-04-19 ENCOUNTER — Other Ambulatory Visit: Payer: Self-pay | Admitting: Internal Medicine

## 2021-04-19 ENCOUNTER — Other Ambulatory Visit: Payer: Self-pay

## 2021-04-19 ENCOUNTER — Encounter (HOSPITAL_BASED_OUTPATIENT_CLINIC_OR_DEPARTMENT_OTHER): Payer: Self-pay

## 2021-04-19 DIAGNOSIS — Z91010 Allergy to peanuts: Secondary | ICD-10-CM | POA: Diagnosis not present

## 2021-04-19 DIAGNOSIS — Z79899 Other long term (current) drug therapy: Secondary | ICD-10-CM | POA: Diagnosis not present

## 2021-04-19 DIAGNOSIS — R519 Headache, unspecified: Secondary | ICD-10-CM | POA: Diagnosis present

## 2021-04-19 DIAGNOSIS — J45909 Unspecified asthma, uncomplicated: Secondary | ICD-10-CM | POA: Diagnosis not present

## 2021-04-19 DIAGNOSIS — I10 Essential (primary) hypertension: Secondary | ICD-10-CM | POA: Insufficient documentation

## 2021-04-19 MED ORDER — IRBESARTAN 300 MG PO TABS: 300.0000 mg | ORAL_TABLET | Freq: Every day | ORAL | 0 refills | Status: DC

## 2021-04-19 MED ORDER — IRBESARTAN 150 MG PO TABS
300.0000 mg | ORAL_TABLET | Freq: Once | ORAL | Status: AC
  Administered 2021-04-19: 21:00:00 300 mg via ORAL
  Filled 2021-04-19: qty 2

## 2021-04-19 NOTE — Discharge Instructions (Addendum)
You are seen in the emergency department today for high blood pressure.  As we discussed I will fill your previous prescription of irbesartan 300 mg, and I like you to take this once daily.  It is incredibly important that you follow-up with Dr. Army Melia for your long-term hypertension management.  Continue to monitor how you're doing and return to the ER for new or worsening symptoms such as headaches, vision changes, pain in your chest, or dizziness.   It has been a pleasure seeing and caring for you today and I hope you start feeling better soon!

## 2021-04-19 NOTE — Telephone Encounter (Signed)
Requested medication (s) are due for refill today: yes  Requested medication (s) are on the active medication list: yes  Last refill:  02/07/21 #30  Future visit scheduled: no  Notes to clinic:  overdue lab work   Requested Prescriptions  Pending Prescriptions Disp Refills   irbesartan (AVAPRO) 300 MG tablet [Pharmacy Med Name: Irbesartan 300 MG Oral Tablet] 30 tablet 0    Sig: Take 1 tablet by mouth once daily     Cardiovascular:  Angiotensin Receptor Blockers Failed - 04/19/2021  3:31 PM      Failed - Cr in normal range and within 180 days    Creatinine, Ser  Date Value Ref Range Status  11/24/2019 1.18 0.76 - 1.27 mg/dL Final          Failed - K in normal range and within 180 days    Potassium  Date Value Ref Range Status  11/24/2019 4.2 3.5 - 5.2 mmol/L Final          Failed - Last BP in normal range    BP Readings from Last 1 Encounters:  03/11/21 (!) 188/94          Passed - Patient is not pregnant      Passed - Valid encounter within last 6 months    Recent Outpatient Visits           5 months ago Essential hypertension   [facility] Glean Hess, MD   7 months ago Facial cellulitis   [facility] Glean Hess, MD   8 months ago Primary hypertension   [facility] Glean Hess, MD   10 months ago Primary hypertension   [facility] Glean Hess, MD   1 year ago Essential hypertension   [facility] Glean Hess, MD

## 2021-04-19 NOTE — ED Provider Notes (Signed)
[facility] EMERGENCY DEPT Provider Note   CSN: [id number] Arrival date & time: 04/19/21  1725     History Chief Complaint  Patient presents with   Hypertension    Alexander Davis is a 60 y.o. male with history of hypertension who presents emergency department complaining of elevated blood pressure.  Patient states that last night he had a nosebleed, so he checked his blood pressure and it was 206/130.  He endorses slight headache at that time, that he described as "barely noticeable".  Patient states that several weeks ago he was seen at a cardiologist specializes in hypertension who started him on spironolactone, in addition to his previously prescribed irbesartan.  Patient states that he had side effects with the spironolactone, and stopped it.  He did not know he was post to continue the irbesartan, so he has been without any hypertension medication for the past several weeks.  He denies headache at this time, any dizziness, chest pain, shortness of breath.   Hypertension Associated symptoms include headaches. Pertinent negatives include no chest pain, no abdominal pain and no shortness of breath.      Past Medical History:  Diagnosis Date   Asthma    As child    Hypertension    Hypertensive urgency 11/11/2019   Obesity 03/11/2021   Precordial chest pain 03/11/2021    Patient Active Problem List   Diagnosis Date Noted   Precordial chest pain 03/11/2021   Obesity 03/11/2021   Vertigo 11/11/2019   Family history of prostate cancer in father 01/19/2017   Pill dysphagia 11/18/2015   Essential hypertension 02/12/2015    Past Surgical History:  Procedure Laterality Date   HERNIA REPAIR     age 71       Family History  Problem Relation Age of Onset   Anemia Mother    Stroke Father    Hypertension Father    Prostate cancer Father 49   Hypertension Sister    Hypertension Sister    Breast cancer Sister    Hypertension Brother     Social  History   Tobacco Use   Smoking status: Never   Smokeless tobacco: Never  Vaping Use   Vaping Use: Never used  Substance Use Topics   Alcohol use: No    Alcohol/week: 0.0 standard drinks   Drug use: No    Home Medications Prior to Admission medications   Medication Sig Start Date End Date Taking? Authorizing Provider  irbesartan (AVAPRO) 300 MG tablet Take 1 tablet (300 mg total) by mouth daily. 04/19/21  Yes Alanys Godino T, PA-C  Ascorbic Acid (VITAMIN C PO) Take 1 tablet by mouth daily.    [provider]  metoprolol tartrate (LOPRESSOR) 25 MG tablet TAKE 1 TABLET 2 HOURS PRIOR TO CT 03/11/21   Skeet Latch, MD  Multiple Vitamins-Minerals (MENS MULTIVITAMIN PO) Take 1 tablet by mouth daily.    [provider]  Nettle, Urtica Dioica, (NETTLE LEAF PO) Take by mouth.    [provider]  OLIVE LEAF PO Take by mouth.    [provider]  spironolactone (ALDACTONE) 25 MG tablet Take 1 tablet (25 mg total) by mouth daily. 03/11/21   Skeet Latch, MD    Allergies    Fish allergy, Norvasc [amlodipine], Peanut-containing drug products, Penicillins, and Hctz [hydrochlorothiazide]  Review of Systems   Review of Systems  Constitutional:  Negative for chills and fever.  HENT:  Positive for nosebleeds.   Eyes:  Negative for visual disturbance.  Respiratory:  Negative for cough and shortness of breath.   Cardiovascular:  Negative for chest pain.  Gastrointestinal:  Negative for abdominal pain, nausea and vomiting.  Neurological:  Positive for headaches. Negative for dizziness, syncope, weakness, light-headedness and numbness.  All other systems reviewed and are negative.  Physical Exam Updated Vital Signs BP (!) 194/123    Pulse (!) 57    Temp 97.8 F (36.6 C)    Resp 17    Ht 6' (1.829 m)    Wt 104.3 kg    SpO2 98%    BMI 31.19 kg/m   Physical Exam Vitals and nursing note reviewed.  Constitutional:      Appearance: Normal appearance.   HENT:     Head: Normocephalic and atraumatic.  Eyes:     Conjunctiva/sclera: Conjunctivae normal.  Cardiovascular:     Rate and Rhythm: Normal rate and regular rhythm.  Pulmonary:     Effort: Pulmonary effort is normal. No respiratory distress.     Breath sounds: Normal breath sounds.  Abdominal:     General: There is no distension.     Palpations: Abdomen is soft.     Tenderness: There is no abdominal tenderness.  Skin:    General: Skin is warm and dry.  Neurological:     General: No focal deficit present.     Mental Status: He is alert.     Comments: Neuro: Speech is clear, able to follow commands. CN III-XII intact grossly intact. PERRLA. EOMI. Sensation intact throughout. Str 5/5 all extremities.    ED Results / Procedures / Treatments   Labs (all labs ordered are listed, but only abnormal results are displayed) Labs Reviewed - No data to display  EKG None  Radiology No results found.  Procedures Procedures   Medications Ordered in ED Medications  irbesartan (AVAPRO) tablet 300 mg (has no administration in time range)    ED Course  I have reviewed the triage vital signs and the nursing notes.  Pertinent labs & imaging results that were available during my care of the patient were reviewed by me and considered in my medical decision making (see chart for details).    MDM Rules/Calculators/A&P                          Patient is a 60 year old male with a history of hypertension who presents to the emergency department complaining of elevated blood pressure.  Patient had a nosebleed yesterday, with a mild headache, and he checked his pressure at that time and it was 206/130.  On my exam patient is afebrile, not tachycardic, no acute distress.  His blood pressures continue to be elevated in the emergency department.  Patient has not been taking his prescribed hypertension medication for the past several weeks, due to confusion with the new prescription by a new  cardiologist.  He states that he had side effects associate with spironolactone, and did not know that he was supposed to continue his irbesartan.  Patient has a normal neurologic exam as above.  He has no chest pain, shortness of breath, headaches, vision changes, dizziness, or numbness.  I have low concern for hypertensive emergency at this time we will treat this like asymptomatic hypertension.  Plan to give him 1 dose of his at home irbesartan, and refill his prescription.  I do not think he requires admission or inpatient treatment for his symptoms at this time.  Discussed reasons to return to the  emergency department, patient agreeable to plan.   Final Clinical Impression(s) / ED Diagnoses Final diagnoses:  Primary hypertension    Rx / DC Orders ED Discharge Orders          Ordered    irbesartan (AVAPRO) 300 MG tablet  Daily        04/19/21 2011           Portions of this report may have been transcribed using voice recognition software. Every effort was made to ensure accuracy; however, inadvertent computerized transcription errors may be present.    Estill Cotta 04/19/21 2032    Teressa Lower, MD 04/20/21 [phone]

## 2021-04-19 NOTE — ED Triage Notes (Addendum)
Pt's BP was 206/130 at UC. Pt had nosebleed last night, so he checked his pressure. Pt endorses slight headache, "barely noticeable".  Pt was on spirolactone, but stopped taking due to abd pain. Pt is not on any BP meds currently.

## 2021-04-22 ENCOUNTER — Other Ambulatory Visit: Payer: Self-pay | Admitting: Internal Medicine

## 2021-04-23 ENCOUNTER — Telehealth: Payer: BC Managed Care – PPO | Admitting: Internal Medicine

## 2021-05-01 ENCOUNTER — Ambulatory Visit: Payer: BC Managed Care – PPO

## 2021-05-01 ENCOUNTER — Telehealth: Payer: Self-pay

## 2021-05-01 DIAGNOSIS — Z0000 Encounter for general adult medical examination without abnormal findings: Secondary | ICD-10-CM

## 2021-05-01 NOTE — Telephone Encounter (Signed)
Patient returned call to reschedule health coaching session. Patient has been rescheduled for 05/02/2021 at 12:45pm. Patient will be called at this time.   Alexander Davis, [address] Guide, Health Coach [address] [place] [address] Telephone: [phone] Email: [email or web address]

## 2021-05-01 NOTE — Telephone Encounter (Signed)
Called patient to hold session as scheduled. Patient did not answer. Left message for patient to return call to hold session or to reschedule.    Mattox Schorr Truman Hayward, [facility] Guide, Health Coach [address], [address] [place] [address] Telephone: [phone] Email: [email or web address]

## 2021-05-02 ENCOUNTER — Ambulatory Visit: Payer: BC Managed Care – PPO

## 2021-05-02 ENCOUNTER — Telehealth: Payer: Self-pay

## 2021-05-02 DIAGNOSIS — Z0000 Encounter for general adult medical examination without abnormal findings: Secondary | ICD-10-CM

## 2021-05-02 NOTE — Telephone Encounter (Signed)
Called patient to hold health coaching session as scheduled. Patient did not answer. Left message for patient to return call to hold session today or to reschedule.  Mekaila Tarnow Truman Hayward, [facility] Guide, Health Coach [address], [address] [place] [address] Telephone: [phone] Email: [email or web address]

## 2021-05-14 ENCOUNTER — Encounter (HOSPITAL_COMMUNITY): Payer: Self-pay | Admitting: Emergency Medicine

## 2021-06-09 ENCOUNTER — Other Ambulatory Visit: Payer: Self-pay | Admitting: Internal Medicine

## 2021-06-09 DIAGNOSIS — I10 Essential (primary) hypertension: Secondary | ICD-10-CM

## 2021-06-10 NOTE — Telephone Encounter (Signed)
Requested medication (s) are due for refill today: yes  Requested medication (s) are on the active medication list: yes  Last refill:  04/22/21 #30 with 0 RF  Future visit scheduled: no  Notes to clinic:  Has already had a curtesy refill and there is no upcoming appointment scheduled. Creatinine and potassium out of normal range and more than 180 days ago, 11/24/2019, last visit more than 6 months, please assess.   Requested Prescriptions  Pending Prescriptions Disp Refills   irbesartan (AVAPRO) 300 MG tablet [Pharmacy Med Name: Irbesartan 300 MG Oral Tablet] 30 tablet 0    Sig: TAKE 1 TABLET BY MOUTH ONCE DAILY . APPOINTMENT REQUIRED FOR FUTURE REFILLS     Cardiovascular:  Angiotensin Receptor Blockers Failed - 06/09/2021  8:49 AM      Failed - Cr in normal range and within 180 days    Creatinine, Ser  Date Value Ref Range Status  11/24/2019 1.18 0.76 - 1.27 mg/dL Final          Failed - K in normal range and within 180 days    Potassium  Date Value Ref Range Status  11/24/2019 4.2 3.5 - 5.2 mmol/L Final          Failed - Last BP in normal range    BP Readings from Last 1 Encounters:  04/19/21 (!) 192/120          Failed - Valid encounter within last 6 months    Recent Outpatient Visits           7 months ago Essential hypertension   [facility] Glean Hess, MD   9 months ago Facial cellulitis   [facility] Glean Hess, MD   10 months ago Primary hypertension   [facility] Glean Hess, MD   12 months ago Primary hypertension   [facility] Glean Hess, MD   1 year ago Essential hypertension   [place], Laura H, MD              Passed - Patient is not pregnant

## 2021-08-27 ENCOUNTER — Other Ambulatory Visit: Payer: Self-pay | Admitting: Internal Medicine

## 2021-08-27 ENCOUNTER — Telehealth: Payer: Self-pay

## 2021-08-27 DIAGNOSIS — I10 Essential (primary) hypertension: Secondary | ICD-10-CM

## 2021-08-27 MED ORDER — IRBESARTAN 300 MG PO TABS: 300.0000 mg | ORAL_TABLET | Freq: Every day | ORAL | 0 refills | Status: DC

## 2021-08-27 NOTE — Telephone Encounter (Signed)
Requested Prescriptions  ?Pending Prescriptions Disp Refills  ?? irbesartan (AVAPRO) 300 MG tablet 30 tablet 0  ?  Sig: Take 1 tablet (300 mg total) by mouth daily.  ?  ? Cardiovascular:  Angiotensin Receptor Blockers Failed - 08/27/2021 10:05 AM  ?  ?  Failed - Cr in normal range and within 180 days  ?  Creatinine, Ser  ?Date Value Ref Range Status  ?11/24/2019 1.18 0.76 - 1.27 mg/dL Final  ?   ?  ?  Failed - K in normal range and within 180 days  ?  Potassium  ?Date Value Ref Range Status  ?11/24/2019 4.2 3.5 - 5.2 mmol/L Final  ?   ?  ?  Failed - Last BP in normal range  ?  BP Readings from Last 1 Encounters:  ?04/19/21 (!) 192/120  ?   ?  ?  Failed - Valid encounter within last 6 months  ?  Recent Outpatient Visits   ?      ? 10 months ago Essential hypertension  ? [facility] Glean Hess, MD  ? 1 year ago Facial cellulitis  ? [facility] Glean Hess, MD  ? 1 year ago Primary hypertension  ? [facility] Glean Hess, MD  ? 1 year ago Primary hypertension  ? [facility] Glean Hess, MD  ? 1 year ago Essential hypertension  ? [facility] Glean Hess, MD  ?  ?  ?Future Appointments   ?        ? In 2 days Glean Hess, MD [facility] [place]  ? In 1 month Cable, Alyson Locket, NP [place] [facility] [place], [place]  ?  ? ?  ?  ?  Passed - Patient is not pregnant  ?  ?  ? ? ?

## 2021-08-27 NOTE — Telephone Encounter (Signed)
Patient will keep his appointment for this Friday at 11:20 and he also mentioned that he will continue to see Dr Army Melia as PCP. ?

## 2021-08-27 NOTE — Telephone Encounter (Signed)
Medication Refill - Medication:  ?irbesartan (AVAPRO) 300 MG tablet ? ?Has the patient contacted their pharmacy? Yes.   ?Contact PCP ? ?Preferred Pharmacy (with phone number or street name):  ?[place], [place] - [address]  ?[address] [place] [address]  ?Phone:  [phone]  Fax:  [phone]  ? ?Has the patient been seen for an appointment in the last year OR does the patient have an upcoming appointment? Yes.   ? ?Agent: Please be advised that RX refills may take up to 3 business days. We ask that you follow-up with your pharmacy. ?

## 2021-08-27 NOTE — Telephone Encounter (Signed)
Patient called the after hours clinic stating that his BP is 181/131 and he has been out of his BP meds for 3 months because he does not want to go back to the cardiologist he was referred to. He said he has not been "successful" with getting an appt at our clinic in the last 3 months. ? ?Please call him to schedule an appt ASAP. See if he can come in tomorrow for BP. ? ?Also, he is scheduled as a new patient at [facility] in June so he may be switching providers? ?

## 2021-08-29 ENCOUNTER — Encounter: Payer: Self-pay | Admitting: Internal Medicine

## 2021-08-29 ENCOUNTER — Ambulatory Visit: Payer: BC Managed Care – PPO | Admitting: Internal Medicine

## 2021-08-29 VITALS — BP 160/110 | HR 64 | Ht 72.0 in | Wt 236.2 lb

## 2021-08-29 DIAGNOSIS — I10 Essential (primary) hypertension: Secondary | ICD-10-CM | POA: Diagnosis not present

## 2021-08-29 DIAGNOSIS — Z1211 Encounter for screening for malignant neoplasm of colon: Secondary | ICD-10-CM | POA: Diagnosis not present

## 2021-08-29 MED ORDER — IRBESARTAN 300 MG PO TABS: 300.0000 mg | ORAL_TABLET | Freq: Every day | ORAL | 0 refills | Status: DC

## 2021-08-29 MED ORDER — SPIRONOLACTONE 25 MG PO TABS: 25.0000 mg | ORAL_TABLET | Freq: Every day | ORAL | 0 refills | Status: DC

## 2021-08-29 NOTE — Progress Notes (Signed)
? ? ?Date:  08/29/2021  ? ?Name:  Alexander Davis   DOB:  Aug 31, 1960   MRN:  [id number] ? ? ?Chief Complaint: Hypertension ? ?Hypertension ?This is a chronic problem. The current episode started more than 1 year ago. The problem has been gradually worsening since onset. The problem is uncontrolled. Pertinent negatives include no chest pain, headaches or shortness of breath. Patient says he has been out of his medication for 3+ months. He was seeing Cardiology but says he does not like the physician and does not wish to go back. ? ?Lab Results  ?Component Value Date  ? NA 143 11/24/2019  ? K 4.2 11/24/2019  ? CO2 22 11/24/2019  ? GLUCOSE 100 (H) 11/24/2019  ? BUN 11 11/24/2019  ? CREATININE 1.18 11/24/2019  ? CALCIUM 9.7 11/24/2019  ? GFRNONAA 68 11/24/2019  ? ?No results found for: CHOL, HDL, LDLCALC, LDLDIRECT, TRIG, CHOLHDL ?Lab Results  ?Component Value Date  ? TSH 1.481 01/19/2017  ? ?No results found for: HGBA1C ?Lab Results  ?Component Value Date  ? WBC 6.7 11/12/2019  ? HGB 12.5 (L) 11/12/2019  ? HCT 37.9 (L) 11/12/2019  ? MCV 94.3 11/12/2019  ? PLT 162 11/12/2019  ? ?Lab Results  ?Component Value Date  ? ALT 28 01/19/2017  ? AST 30 01/19/2017  ? ALKPHOS 110 01/19/2017  ? BILITOT 0.9 01/19/2017  ? ?No results found for: 25OHVITD2, [place], VD25OH  ? ?Review of Systems  ?Constitutional:  Negative for chills, fatigue and fever.  ?Respiratory:  Negative for chest tightness and shortness of breath.   ?Cardiovascular:  Negative for chest pain.  ?Neurological:  Negative for dizziness and headaches. Light-headedness: occasional transient symptoms. ?Psychiatric/Behavioral:  Negative for dysphoric mood and sleep disturbance. The patient is not nervous/anxious.   ? ?Patient Active Problem List  ? Diagnosis Date Noted  ? Precordial chest pain 03/11/2021  ? Obesity 03/11/2021  ? Vertigo 11/11/2019  ? Family history of prostate cancer in father 01/19/2017  ? Pill dysphagia 11/18/2015  ? Essential hypertension  02/12/2015  ? ? ?Allergies  ?Allergen Reactions  ? Fish Allergy Anaphylaxis  ? Norvasc [Amlodipine] Swelling  ? Peanut-Containing Drug Products Anaphylaxis and Swelling  ?  Lips swell  ? Penicillins Anaphylaxis  ?  Has patient had a PCN reaction causing immediate rash, facial/tongue/throat swelling, SOB or lightheadedness with hypotension: Unknown ?Has patient had a PCN reaction causing severe rash involving mucus membranes or skin necrosis: Unknown ?Has patient had a PCN reaction that required hospitalization: Unknown ?Has patient had a PCN reaction occurring within the last 10 years: No ?If all of the above answers are "NO", then may proceed with Cephalosporin use. ? ?  ? Hctz [Hydrochlorothiazide] Other (See Comments)  ?  Muscle cramps  ? ? ?Past Surgical History:  ?Procedure Laterality Date  ? HERNIA REPAIR    ? age 33  ? ? ?Social History  ? ?Tobacco Use  ? Smoking status: Never  ? Smokeless tobacco: Never  ?Vaping Use  ? Vaping Use: Never used  ?Substance Use Topics  ? Alcohol use: No  ?  Alcohol/week: 0.0 standard drinks  ? Drug use: No  ? ? ? ?Medication list has been reviewed and updated. ? ?Current Meds  ?Medication Sig  ? Ascorbic Acid (VITAMIN C PO) Take 1 tablet by mouth daily.  ? Multiple Vitamins-Minerals (MENS MULTIVITAMIN PO) Take 1 tablet by mouth daily.  ? [DISCONTINUED] spironolactone (ALDACTONE) 25 MG tablet Take 1 tablet (25 mg total) by mouth  daily.  ? ? ? ?  08/29/2021  ? 11:22 AM 10/22/2020  ?  9:54 AM 08/23/2020  ?  9:15 AM 07/29/2020  ?  3:06 PM  ?GAD 7 : Generalized Anxiety Score  ?Nervous, Anxious, on Edge 0 0 2 1  ?Control/stop worrying 0 0 1 1  ?Worry too much - different things '1 1 1 1  '$ ?Trouble relaxing 0 0 0 0  ?Restless 0 0 0 0  ?Easily annoyed or irritable 0 1 1 0  ?Afraid - awful might happen 0 0 0 0  ?Total GAD 7 Score '1 2 5 3  '$ ?Anxiety Difficulty Somewhat difficult  Somewhat difficult   ? ? ? ?  08/29/2021  ? 11:22 AM  ?Depression screen PHQ 2/9  ?Decreased Interest 0  ?Down, Depressed,  Hopeless 1  ?PHQ - 2 Score 1  ?Altered sleeping 0  ?Tired, decreased energy 1  ?Change in appetite 0  ?Feeling bad or failure about yourself  1  ?Trouble concentrating 0  ?Moving slowly or fidgety/restless 0  ?Suicidal thoughts 0  ?PHQ-9 Score 3  ?Difficult doing work/chores Not difficult at all  ? ? ?BP Readings from Last 3 Encounters:  ?08/29/21 (!) 160/110  ?04/19/21 (!) 192/120  ?03/11/21 (!) 188/94  ? ? ?Physical Exam ?Vitals and nursing note reviewed.  ?Constitutional:   ?   General: He is not in acute distress. ?   Appearance: Normal appearance. He is well-developed.  ?HENT:  ?   Head: Normocephalic and atraumatic.  ?Cardiovascular:  ?   Rate and Rhythm: Normal rate and regular rhythm.  ?Pulmonary:  ?   Effort: Pulmonary effort is normal. No respiratory distress.  ?   Breath sounds: No wheezing or rhonchi.  ?Musculoskeletal:  ?   Cervical back: Normal range of motion.  ?   Right lower leg: No edema.  ?   Left lower leg: No edema.  ?Skin: ?   General: Skin is warm and dry.  ?   Capillary Refill: Capillary refill takes less than 2 seconds.  ?   Findings: No rash.  ?Neurological:  ?   General: No focal deficit present.  ?   Mental Status: He is alert and oriented to person, place, and time.  ?Psychiatric:     ?   Mood and Affect: Mood normal.     ?   Behavior: Behavior normal.  ? ? ?Wt Readings from Last 3 Encounters:  ?08/29/21 236 lb 3.2 oz (107.1 kg)  ?04/19/21 230 lb (104.3 kg)  ?03/11/21 238 lb (108 kg)  ? ? ?BP (!) 160/110 (BP Location: Left Arm, Cuff Size: Large)   Pulse 64   Ht 6' (1.829 m)   Wt 236 lb 3.2 oz (107.1 kg)   SpO2 97%   BMI 32.03 kg/m?  ? ?Assessment and Plan: ?1. Primary hypertension ?BP elevated due to lack of medication ?Minimally symptomatic despite high readings ?We discussed the importance of salt restriction in addition to medication adherence. ?Resume irbesartan and spironolactone. ?Follow up in 6 weeks, labs at that time ?- irbesartan (AVAPRO) 300 MG tablet; Take 1 tablet (300  mg total) by mouth daily.  Dispense: 90 tablet; Refill: 0 ?- spironolactone (ALDACTONE) 25 MG tablet; Take 1 tablet (25 mg total) by mouth daily.  Dispense: 90 tablet; Refill: 0 ? ?2. Colon cancer screening ?- Ambulatory referral to Gastroenterology ? ? ?Partially dictated using Editor, commissioning. Any errors are unintentional. ? ?Halina Maidens, MD ?[facility], [place] ?[facility] ? ?  08/29/2021 ? ? ? ? ? ?

## 2021-09-01 ENCOUNTER — Telehealth: Payer: Self-pay

## 2021-09-01 NOTE — Telephone Encounter (Signed)
CALLED PATIENT NO ANSWER LEFT VOICEMAIL FOR A CALL BACK ? ?

## 2021-09-02 ENCOUNTER — Telehealth: Payer: Self-pay

## 2021-09-02 NOTE — Telephone Encounter (Signed)
CALLED PATIENT NO ANSWER LEFT VOICEMAIL FOR A CALL BACK ? ?

## 2021-09-02 NOTE — Telephone Encounter (Signed)
CALLED PATIENT NO ANSWER LEFT VOICEMAIL FOR A CALL BACK °Letter sent °

## 2021-09-12 ENCOUNTER — Emergency Department: Payer: BC Managed Care – PPO

## 2021-09-12 ENCOUNTER — Emergency Department
Admission: EM | Admit: 2021-09-12 | Discharge: 2021-09-12 | Disposition: A | Discharge status: Home / Self Care | Payer: BC Managed Care – PPO | Attending: Student in an Organized Health Care Education/Training Program | Admitting: Student in an Organized Health Care Education/Training Program

## 2021-09-12 DIAGNOSIS — R109 Unspecified abdominal pain: Secondary | ICD-10-CM | POA: Insufficient documentation

## 2021-09-12 DIAGNOSIS — M25562 Pain in left knee: Secondary | ICD-10-CM | POA: Insufficient documentation

## 2021-09-12 DIAGNOSIS — M545 Low back pain, unspecified: Secondary | ICD-10-CM | POA: Diagnosis present

## 2021-09-12 DIAGNOSIS — Y92410 Unspecified street and highway as the place of occurrence of the external cause: Secondary | ICD-10-CM | POA: Diagnosis not present

## 2021-09-12 DIAGNOSIS — M7918 Myalgia, other site: Secondary | ICD-10-CM

## 2021-09-12 LAB — COMPREHENSIVE METABOLIC PANEL
ALT: 18 U/L (ref 0–44)
AST: 24 U/L (ref 15–41)
Albumin: 3.9 g/dL (ref 3.5–5.0)
Alkaline Phosphatase: 92 U/L (ref 38–126)
Anion gap: 9 (ref 5–15)
BUN: 10 mg/dL (ref 6–20)
CO2: 26 mmol/L (ref 22–32)
Calcium: 9.1 mg/dL (ref 8.9–10.3)
Chloride: 105 mmol/L (ref 98–111)
Creatinine, Ser: 0.98 mg/dL (ref 0.61–1.24)
GFR, Estimated: >60 mL/min (ref >60)
Glucose, Bld: 122 mg/dL — ABNORMAL HIGH (ref 70–99)
Potassium: 3.6 mmol/L (ref 3.5–5.1)
Sodium: 140 mmol/L (ref 135–145)
Total Bilirubin: 0.8 mg/dL (ref 0.3–1.2)
Total Protein: 7.8 g/dL (ref 6.5–8.1)

## 2021-09-12 LAB — CBC
HCT: 37.8 % — ABNORMAL LOW (ref 39.0–52.0)
Hemoglobin: 12.4 g/dL — ABNORMAL LOW (ref 13.0–17.0)
MCH: 31.3 pg (ref 26.0–34.0)
MCHC: 32.8 g/dL (ref 30.0–36.0)
MCV: 95.5 fL (ref 80.0–100.0)
Platelets: 148 10*3/uL — ABNORMAL LOW (ref 150–400)
RBC: 3.96 MIL/uL — ABNORMAL LOW (ref 4.22–5.81)
RDW: 12.2 % (ref 11.5–15.5)
WBC: 5 10*3/uL (ref 4.0–10.5)
nRBC: 0 % (ref 0.0–0.2)

## 2021-09-12 LAB — LIPASE, BLOOD: Lipase: 30 U/L (ref 11–51)

## 2021-09-12 MED ORDER — TRAMADOL HCL 50 MG PO TABS: 50.0000 mg | ORAL_TABLET | Freq: Four times a day (QID) | ORAL | 0 refills | Status: DC | PRN

## 2021-09-12 MED ORDER — CYCLOBENZAPRINE HCL 10 MG PO TABS
5.0000 mg | ORAL_TABLET | Freq: Once | ORAL | Status: AC
Start: 2021-09-12 — End: 2021-09-12
  Administered 2021-09-12: 5 mg via ORAL
  Filled 2021-09-12: qty 1

## 2021-09-12 MED ORDER — IOHEXOL 300 MG/ML  SOLN
100.0000 mL | Freq: Once | INTRAMUSCULAR | Status: AC | PRN
  Administered 2021-09-12: 100 mL via INTRAVENOUS

## 2021-09-12 MED ORDER — ACETAMINOPHEN 325 MG PO TABS
650.0000 mg | ORAL_TABLET | Freq: Once | ORAL | Status: AC
Start: 2021-09-12 — End: 2021-09-12
  Administered 2021-09-12: 650 mg via ORAL
  Filled 2021-09-12: qty 2

## 2021-09-12 MED ORDER — CYCLOBENZAPRINE HCL 5 MG PO TABS: 5.0000 mg | ORAL_TABLET | Freq: Three times a day (TID) | ORAL | 0 refills | Status: DC | PRN
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
## 2021-09-29 ENCOUNTER — Ambulatory Visit: Payer: BC Managed Care – PPO | Admitting: Nurse Practitioner

## 2021-10-10 ENCOUNTER — Ambulatory Visit: Payer: BC Managed Care – PPO | Admitting: Internal Medicine

## 2021-11-18 ENCOUNTER — Ambulatory Visit: Payer: BC Managed Care – PPO | Admitting: Internal Medicine

## 2021-12-12 ENCOUNTER — Other Ambulatory Visit: Payer: Self-pay | Admitting: Internal Medicine

## 2021-12-16 ENCOUNTER — Other Ambulatory Visit: Payer: Self-pay | Admitting: Internal Medicine

## 2021-12-17 ENCOUNTER — Ambulatory Visit
Admission: RE | Admit: 2021-12-17 | Discharge: 2021-12-17 | Disposition: A | Discharge status: Home / Self Care | Payer: BC Managed Care – PPO | Source: Ambulatory Visit | Attending: Internal Medicine | Admitting: Internal Medicine

## 2021-12-17 ENCOUNTER — Encounter: Payer: Self-pay | Admitting: Internal Medicine

## 2021-12-17 ENCOUNTER — Ambulatory Visit: Payer: BC Managed Care – PPO | Admitting: Internal Medicine

## 2021-12-17 ENCOUNTER — Ambulatory Visit
Admission: RE | Admit: 2021-12-17 | Discharge: 2021-12-17 | Disposition: A | Discharge status: Home / Self Care | Payer: BC Managed Care – PPO | Attending: Internal Medicine | Admitting: Internal Medicine

## 2021-12-17 VITALS — BP 172/102 | HR 70 | Ht 72.0 in | Wt 236.0 lb

## 2021-12-17 DIAGNOSIS — I10 Essential (primary) hypertension: Secondary | ICD-10-CM | POA: Diagnosis not present

## 2021-12-17 DIAGNOSIS — I517 Cardiomegaly: Secondary | ICD-10-CM

## 2021-12-17 DIAGNOSIS — K529 Noninfective gastroenteritis and colitis, unspecified: Secondary | ICD-10-CM | POA: Diagnosis not present

## 2021-12-17 MED ORDER — IRBESARTAN 300 MG PO TABS: 300.0000 mg | ORAL_TABLET | Freq: Every day | ORAL | 0 refills | Status: DC

## 2021-12-17 MED ORDER — SPIRONOLACTONE 25 MG PO TABS: 25.0000 mg | ORAL_TABLET | Freq: Every day | ORAL | 0 refills | Status: DC

## 2021-12-17 NOTE — Progress Notes (Signed)
Date:  12/17/2021   Name:  Alexander Davis   DOB:  08-20-1960   MRN:  [id number]   Chief Complaint: Hypertension and Diarrhea (X 1 day, loose stools)  Hypertension This is a chronic problem. The problem is resistant. Associated symptoms include sweats. Pertinent negatives include no chest pain, headaches or shortness of breath. There are no known risk factors for coronary artery disease. Past treatments include ACE inhibitors and diuretics.  Diarrhea  This is a new problem. Episode onset: X1 day. The problem occurs 2 to 4 times per day. The problem has been unchanged. The stool consistency is described as Watery. The patient states that diarrhea awakens him from sleep. Associated symptoms include sweats. Pertinent negatives include no abdominal pain, arthralgias, chills, fever, headaches or vomiting. Nothing aggravates the symptoms. Risk factors include suspect food intake. He has tried nothing for the symptoms.    Lab Results  Component Value Date   NA 140 09/12/2021   K 3.6 09/12/2021   CO2 26 09/12/2021   GLUCOSE 122 (H) 09/12/2021   BUN 10 09/12/2021   CREATININE 0.98 09/12/2021   CALCIUM 9.1 09/12/2021   GFRNONAA >60 09/12/2021   No results found for: "CHOL", "HDL", "LDLCALC", "LDLDIRECT", "TRIG", "CHOLHDL" Lab Results  Component Value Date   TSH 1.481 01/19/2017   No results found for: "HGBA1C" Lab Results  Component Value Date   WBC 5.0 09/12/2021   HGB 12.4 (L) 09/12/2021   HCT 37.8 (L) 09/12/2021   MCV 95.5 09/12/2021   PLT 148 (L) 09/12/2021   Lab Results  Component Value Date   ALT 18 09/12/2021   AST 24 09/12/2021   ALKPHOS 92 09/12/2021   BILITOT 0.8 09/12/2021   No results found for: "25OHVITD2", "25OHVITD3", "VD25OH"   Review of Systems  Constitutional:  Negative for chills, fatigue and fever.  Respiratory:  Negative for chest tightness and shortness of breath.   Cardiovascular:  Negative for chest pain.  Gastrointestinal:  Positive for  diarrhea. Negative for abdominal pain, blood in stool and vomiting.  Musculoskeletal:  Positive for neck stiffness. Negative for arthralgias and joint swelling.  Neurological:  Negative for dizziness, light-headedness and headaches.  Psychiatric/Behavioral:  Negative for dysphoric mood. The patient is not nervous/anxious.     Patient Active Problem List   Diagnosis Date Noted   Precordial chest pain 03/11/2021   Obesity 03/11/2021   Vertigo 11/11/2019   Family history of prostate cancer in father 01/19/2017   Pill dysphagia 11/18/2015   Essential hypertension 02/12/2015    Allergies  Allergen Reactions   Fish Allergy Anaphylaxis   Norvasc [Amlodipine] Swelling   Peanut-Containing Drug Products Anaphylaxis and Swelling    Lips swell   Penicillins Anaphylaxis    Has patient had a PCN reaction causing immediate rash, facial/tongue/throat swelling, SOB or lightheadedness with hypotension: Unknown Has patient had a PCN reaction causing severe rash involving mucus membranes or skin necrosis: Unknown Has patient had a PCN reaction that required hospitalization: Unknown Has patient had a PCN reaction occurring within the last 10 years: No If all of the above answers are "NO", then may proceed with Cephalosporin use.     Hctz [Hydrochlorothiazide] Other (See Comments)    Muscle cramps    Past Surgical History:  Procedure Laterality Date   HERNIA REPAIR     age 25    Social History   Tobacco Use   Smoking status: Never   Smokeless tobacco: Never  Vaping Use   Vaping Use:  Never used  Substance Use Topics   Alcohol use: No    Alcohol/week: 0.0 standard drinks of alcohol   Drug use: No     Medication list has been reviewed and updated.  Current Meds  Medication Sig   Ascorbic Acid (VITAMIN C PO) Take 1 tablet by mouth daily.   irbesartan (AVAPRO) 300 MG tablet Take 1 tablet (300 mg total) by mouth daily.   Multiple Vitamins-Minerals (MENS MULTIVITAMIN PO) Take 1 tablet  by mouth daily.   Nettle, Urtica Dioica, (NETTLE LEAF PO) Take by mouth.   OLIVE LEAF PO Take by mouth.   spironolactone (ALDACTONE) 25 MG tablet Take 1 tablet (25 mg total) by mouth daily.       12/17/2021    1:41 PM 08/29/2021   11:22 AM 10/22/2020    9:54 AM 08/23/2020    9:15 AM  GAD 7 : Generalized Anxiety Score  Nervous, Anxious, on Edge 0 0 0 2  Control/stop worrying 0 0 0 1  Worry too much - different things '1 1 1 1  '$ Trouble relaxing 0 0 0 0  Restless 0 0 0 0  Easily annoyed or irritable 0 0 1 1  Afraid - awful might happen 1 0 0 0  Total GAD 7 Score '2 1 2 5  '$ Anxiety Difficulty Not difficult at all Somewhat difficult  Somewhat difficult       12/17/2021    1:41 PM 08/29/2021   11:22 AM 10/22/2020    9:54 AM  Depression screen PHQ 2/9  Decreased Interest 1 0 1  Down, Depressed, Hopeless 1 1 0  PHQ - 2 Score '2 1 1  '$ Altered sleeping 0 0 0  Tired, decreased energy '1 1 1  '$ Change in appetite 1 0 0  Feeling bad or failure about yourself  1 1 0  Trouble concentrating 0 0 0  Moving slowly or fidgety/restless 0 0 0  Suicidal thoughts 0 0 0  PHQ-9 Score '5 3 2  '$ Difficult doing work/chores Not difficult at all Not difficult at all Not difficult at all    BP Readings from Last 3 Encounters:  12/17/21 (!) 172/102  09/12/21 (!) 205/122  08/29/21 (!) 160/110    Physical Exam Vitals and nursing note reviewed.  Constitutional:      General: He is not in acute distress.    Appearance: He is well-developed.  HENT:     Head: Normocephalic and atraumatic.  Cardiovascular:     Rate and Rhythm: Normal rate and regular rhythm.  Pulmonary:     Effort: Pulmonary effort is normal. No respiratory distress.     Breath sounds: No wheezing or rhonchi.  Abdominal:     General: Abdomen is flat. Bowel sounds are normal.     Palpations: Abdomen is soft.     Tenderness: There is no abdominal tenderness. There is no guarding or rebound.  Musculoskeletal:     Cervical back: Normal range of  motion.  Lymphadenopathy:     Cervical: No cervical adenopathy.  Skin:    General: Skin is warm and dry.     Findings: No rash.  Neurological:     Mental Status: He is alert and oriented to person, place, and time.  Psychiatric:        Mood and Affect: Mood normal.        Behavior: Behavior normal.     Wt Readings from Last 3 Encounters:  12/17/21 236 lb (107 kg)  08/29/21 236 lb 3.2  oz (107.1 kg)  04/19/21 230 lb (104.3 kg)    BP (!) 172/102   Pulse 70   Ht 6' (1.829 m)   Wt 236 lb (107 kg)   SpO2 97%   BMI 32.01 kg/m   Assessment and Plan: 1. Primary hypertension BP not well controlled but improved. Resume spironolactone, continue Avapro Follow up 2 months - irbesartan (AVAPRO) 300 MG tablet; Take 1 tablet (300 mg total) by mouth daily.  Dispense: 90 tablet; Refill: 0 - spironolactone (ALDACTONE) 25 MG tablet; Take 1 tablet (25 mg total) by mouth daily.  Dispense: 90 tablet; Refill: 0  2. Mild cardiomegaly Will get CXR to confirm then refer if needed - DG Chest 2 View  3. Gastroenteritis Likely food intolerance rather than food poisoning He is improving with bland diet - continue conservative care and follow up if needed   Partially dictated using Dragon software. Any errors are unintentional.  Halina Maidens, MD [facility]  12/17/2021

## 2021-12-19 ENCOUNTER — Telehealth: Payer: Self-pay

## 2021-12-19 ENCOUNTER — Other Ambulatory Visit: Payer: Self-pay | Admitting: Internal Medicine

## 2021-12-19 DIAGNOSIS — I517 Cardiomegaly: Secondary | ICD-10-CM

## 2021-12-19 NOTE — Telephone Encounter (Signed)
Chest xray report from [place] at [facility] radiology:  Widening of the mediastinum.  Recommend ct with contrast, and nodular opacity at left lung bas.Marland Kitchen

## 2021-12-19 NOTE — Telephone Encounter (Signed)
Dr. Army Melia aware she ordered CT scan.  KP

## 2021-12-22 ENCOUNTER — Telehealth: Payer: Self-pay | Admitting: Internal Medicine

## 2021-12-22 NOTE — Telephone Encounter (Signed)
Copied from [place] [id number]. Topic: General - Other >> Dec 22, 2021 12:40 PM Everette C wrote: Reason for CRM: The patient has called to request that the orders for their imaging be transferred to [facility]  [address] [place], [place] [phone]  Please contact further when possible

## 2021-12-22 NOTE — Telephone Encounter (Signed)
Sent message to Lionel December to change location for the pt.  KP

## 2021-12-23 ENCOUNTER — Ambulatory Visit: Payer: BC Managed Care – PPO

## 2021-12-26 ENCOUNTER — Telehealth: Payer: Self-pay

## 2021-12-26 NOTE — Telephone Encounter (Signed)
Spoke to [employer] she change the location and schedule an appointment for the patient.  KP

## 2021-12-26 NOTE — Telephone Encounter (Signed)
-----   Message from Glean Hess, MD sent at 12/26/2021 12:56 PM EDT ----- Please find out why his CT chest has not been scheduled.  ----- Message ----- From: SYSTEM Sent: 12/24/2021  12:16 AM EDT To: Glean Hess, MD

## 2022-01-27 ENCOUNTER — Telehealth: Payer: Self-pay | Admitting: Internal Medicine

## 2022-01-27 NOTE — Telephone Encounter (Signed)
Please call patient to let him know that his CT showed dilation of the aorta, enlarged heart and several small lung nodules - 3 in the left lung and one in the right. Will refer him to vascular surgery for follow up and recommendation on the aortic dilation.  Will also refer him to cardiology for further cardiac testing.  The Pulmonary nodules will need a repeat CT of the chest in 6 months.

## 2022-01-28 ENCOUNTER — Ambulatory Visit: Payer: Self-pay | Admitting: *Deleted

## 2022-01-28 ENCOUNTER — Other Ambulatory Visit: Payer: Self-pay | Admitting: Internal Medicine

## 2022-01-28 ENCOUNTER — Telehealth: Payer: Self-pay | Admitting: Internal Medicine

## 2022-01-28 DIAGNOSIS — I517 Cardiomegaly: Secondary | ICD-10-CM

## 2022-01-28 DIAGNOSIS — I10 Essential (primary) hypertension: Secondary | ICD-10-CM

## 2022-01-28 MED ORDER — IRBESARTAN 300 MG PO TABS: 300.0000 mg | ORAL_TABLET | Freq: Every day | ORAL | 0 refills | Status: DC

## 2022-01-28 NOTE — Telephone Encounter (Signed)
Pt called in and was given the CT scan result from Dr. Army Melia dated 01/27/2022 at 8:05 AM  I let him know it's in his MyChart too since it's a lot of information.    He has asked that Dr. Army Melia give him a call.   He has several questions regarding the scan.   Reason for Disposition  [1] Follow-up call to recent contact AND [2] information only call, no triage required  Answer Assessment - Initial Assessment Questions 1. REASON FOR CALL or QUESTION: "What is your reason for calling today?" or "How can I best help you?" or "What question do you have that I can help answer?"     Called in and was given his CT scan results form Dr. Army Melia.  Protocols used: Information Only Call - No Triage-A-AH

## 2022-01-28 NOTE — Telephone Encounter (Signed)
Called pt and left VM asking him to call the office back about these results. Phone went straight to VM. PEC may give results when patient returns call. Crm created.  - Ronny Korff

## 2022-01-28 NOTE — Telephone Encounter (Signed)
Copied from [place]. Topic: Quick Sport and exercise psychologist Patient (Clinic Use ONLY) >> Jan 28, 2022  3:00 PM Chassidy M wrote: Reason for CRM: CT Results. Please give patient the results below if he returns our call.  Results: "His CT showed dilation of the aorta, enlarged heart and several small lung nodules - 3 in the left lung and one in the right. Will refer him to vascular surgery for follow up and recommendation on the aortic dilation.  Will also refer him to cardiology for further cardiac testing.  The Pulmonary nodules will need a repeat CT of the chest in 6 months." >> Jan 28, 2022  4:09 PM Ja-Kwan M wrote: Pt stated he spoke with a nurse about his results and he was told that the information would be on his [facility] but he just looked and the results are not there. Pt stated he needs Dr. Army Melia to return his call to discuss. Cb# [phone]

## 2022-01-28 NOTE — Progress Notes (Unsigned)
Spoke with patient at length via phone regarding his CT findings.  He is agreeable to seeing cardiology first and then getting a recommendation from them for a CVTS to follow his aortic aneurysm.  He has been cutting his Avapro in half due to [facility] claiming not to have the Rx sent in August.  New Rx sent.  Patient urged to let us know if he does not get the refill and if he does not hear from cardiology within the next week. Will mail him a copy of the CT report.

## 2022-01-29 NOTE — Telephone Encounter (Signed)
Dr. Army Melia spoke with patient and informed of results. Mailed a copy of patients CT results to the patient.  - Anwyn Kriegel

## 2022-02-16 ENCOUNTER — Encounter: Payer: Self-pay | Admitting: Cardiovascular Disease

## 2022-02-19 ENCOUNTER — Ambulatory Visit: Payer: BC Managed Care – PPO | Admitting: Internal Medicine

## 2022-02-20 ENCOUNTER — Encounter: Payer: Self-pay | Admitting: Internal Medicine

## 2022-02-20 ENCOUNTER — Ambulatory Visit: Payer: BC Managed Care – PPO | Admitting: Internal Medicine

## 2022-02-20 VITALS — BP 129/79 | HR 72 | Ht 72.0 in | Wt 235.8 lb

## 2022-02-20 DIAGNOSIS — I7121 Aneurysm of the ascending aorta, without rupture: Secondary | ICD-10-CM

## 2022-02-20 DIAGNOSIS — I10 Essential (primary) hypertension: Secondary | ICD-10-CM

## 2022-02-20 DIAGNOSIS — I517 Cardiomegaly: Secondary | ICD-10-CM | POA: Insufficient documentation

## 2022-02-20 MED ORDER — SPIRONOLACTONE 25 MG PO TABS: 25.0000 mg | ORAL_TABLET | Freq: Every day | ORAL | 0 refills | Status: DC

## 2022-02-20 NOTE — Progress Notes (Signed)
Date:  02/20/2022   Name:  Alexander Davis   DOB:  [birth year]   MRN:  [id number]   Chief Complaint: Hypertension  Hypertension This is a chronic problem. The problem is resistant. Pertinent negatives include no blurred vision, chest pain, headaches or shortness of breath. There are no associated agents to hypertension. Past treatments include angiotensin blockers and diuretics (he is now taking medications regularly). The current treatment provides significant improvement. Compliance problems: he also cut out excess sodium and caffeine.  Hypertensive end-organ damage includes CAD/MI. There is no history of kidney disease or CVA.    Lab Results  Component Value Date   NA 140 09/12/2021   K 3.6 09/12/2021   CO2 26 09/12/2021   GLUCOSE 122 (H) 09/12/2021   BUN 10 09/12/2021   CREATININE 0.98 09/12/2021   CALCIUM 9.1 09/12/2021   GFRNONAA >60 09/12/2021   No results found for: "CHOL", "HDL", "LDLCALC", "LDLDIRECT", "TRIG", "CHOLHDL" Lab Results  Component Value Date   TSH 1.481 01/19/2017   No results found for: "HGBA1C" Lab Results  Component Value Date   WBC 5.0 09/12/2021   HGB 12.4 (L) 09/12/2021   HCT 37.8 (L) 09/12/2021   MCV 95.5 09/12/2021   PLT 148 (L) 09/12/2021   Lab Results  Component Value Date   ALT 18 09/12/2021   AST 24 09/12/2021   ALKPHOS 92 09/12/2021   BILITOT 0.8 09/12/2021   No results found for: "25OHVITD2", "25OHVITD3", "VD25OH"   Review of Systems  Constitutional:  Negative for chills, fatigue and fever.  Eyes:  Negative for blurred vision.  Respiratory:  Negative for chest tightness and shortness of breath.   Cardiovascular:  Negative for chest pain and leg swelling.  Neurological:  Negative for headaches.  Psychiatric/Behavioral:  Negative for dysphoric mood and sleep disturbance. The patient is not nervous/anxious.     Patient Active Problem List   Diagnosis Date Noted   Mild cardiomegaly 02/20/2022   Aneurysm of ascending  aorta without rupture ([place]) 02/20/2022   Precordial chest pain 03/11/2021   Obesity 03/11/2021   Vertigo 11/11/2019   Family history of prostate cancer in father 01/19/2017   Pill dysphagia 11/18/2015   Essential hypertension 02/12/2015    Allergies  Allergen Reactions   Fish Allergy Anaphylaxis   Norvasc [Amlodipine] Swelling   Peanut-Containing Drug Products Anaphylaxis and Swelling    Lips swell   Penicillins Anaphylaxis    Has patient had a PCN reaction causing immediate rash, facial/tongue/throat swelling, SOB or lightheadedness with hypotension: Unknown Has patient had a PCN reaction causing severe rash involving mucus membranes or skin necrosis: Unknown Has patient had a PCN reaction that required hospitalization: Unknown Has patient had a PCN reaction occurring within the last 10 years: No If all of the above answers are "NO", then may proceed with Cephalosporin use.     Hctz [Hydrochlorothiazide] Other (See Comments)    Muscle cramps    Past Surgical History:  Procedure Laterality Date   HERNIA REPAIR     age 62    Social History   Tobacco Use   Smoking status: Never   Smokeless tobacco: Never  Vaping Use   Vaping Use: Never used  Substance Use Topics   Alcohol use: No    Alcohol/week: 0.0 standard drinks of alcohol   Drug use: No     Medication list has been reviewed and updated.  Current Meds  Medication Sig   Ascorbic Acid (VITAMIN C PO) Take 1 tablet by  mouth daily.   irbesartan (AVAPRO) 300 MG tablet Take 1 tablet (300 mg total) by mouth daily.   Multiple Vitamins-Minerals (MENS MULTIVITAMIN PO) Take 1 tablet by mouth daily.   Nettle, Urtica Dioica, (NETTLE LEAF PO) Take by mouth.   OLIVE LEAF PO Take by mouth.   spironolactone (ALDACTONE) 25 MG tablet Take 1 tablet (25 mg total) by mouth daily.       12/17/2021    1:41 PM 08/29/2021   11:22 AM 10/22/2020    9:54 AM 08/23/2020    9:15 AM  GAD 7 : Generalized Anxiety Score  Nervous, Anxious, on  Edge 0 0 0 2  Control/stop worrying 0 0 0 1  Worry too much - different things '1 1 1 1  '$ Trouble relaxing 0 0 0 0  Restless 0 0 0 0  Easily annoyed or irritable 0 0 1 1  Afraid - awful might happen 1 0 0 0  Total GAD 7 Score '2 1 2 5  '$ Anxiety Difficulty Not difficult at all Somewhat difficult  Somewhat difficult       12/17/2021    1:41 PM 08/29/2021   11:22 AM 10/22/2020    9:54 AM  Depression screen PHQ 2/9  Decreased Interest 1 0 1  Down, Depressed, Hopeless 1 1 0  PHQ - 2 Score '2 1 1  '$ Altered sleeping 0 0 0  Tired, decreased energy '1 1 1  '$ Change in appetite 1 0 0  Feeling bad or failure about yourself  1 1 0  Trouble concentrating 0 0 0  Moving slowly or fidgety/restless 0 0 0  Suicidal thoughts 0 0 0  PHQ-9 Score '5 3 2  '$ Difficult doing work/chores Not difficult at all Not difficult at all Not difficult at all    BP Readings from Last 3 Encounters:  02/20/22 129/79  12/17/21 (!) 172/102  09/12/21 (!) 205/122    Physical Exam Vitals and nursing note reviewed.  Constitutional:      General: He is not in acute distress.    Appearance: Normal appearance. He is well-developed.  HENT:     Head: Normocephalic and atraumatic.  Cardiovascular:     Rate and Rhythm: Normal rate and regular rhythm.  Pulmonary:     Effort: Pulmonary effort is normal. No respiratory distress.     Breath sounds: No wheezing or rhonchi.  Musculoskeletal:     Cervical back: Normal range of motion.     Right lower leg: No edema.     Left lower leg: No edema.  Lymphadenopathy:     Cervical: No cervical adenopathy.  Skin:    General: Skin is warm and dry.     Findings: No rash.  Neurological:     Mental Status: He is alert and oriented to person, place, and time.  Psychiatric:        Mood and Affect: Mood normal.        Behavior: Behavior normal.     Wt Readings from Last 3 Encounters:  02/20/22 235 lb 12.8 oz (107 kg)  12/17/21 236 lb (107 kg)  08/29/21 236 lb 3.2 oz (107.1 kg)     BP 129/79 (BP Location: Right Arm, Patient Position: Sitting, Cuff Size: Normal)   Pulse 72   Ht 6' (1.829 m)   Wt 235 lb 12.8 oz (107 kg)   SpO2 98%   BMI 31.98 kg/m   Assessment and Plan: 1. Essential hypertension BP is much improved with regular medication, sodium restriction and  caffeine reduction. He feels well and is not having any intolerance to medication. - Basic metabolic panel - spironolactone (ALDACTONE) 25 MG tablet; Take 1 tablet (25 mg total) by mouth daily.  Dispense: 90 tablet; Refill: 0  2. Mild cardiomegaly Needs to establish with Cardiology - Ambulatory referral to Cardiology  3. Aneurysm of ascending aorta without rupture [facility]) - Ambulatory referral to Cardiology    Partially dictated using [place]. Any errors are unintentional.  Halina Maidens, MD [facility]  02/20/2022

## 2022-03-31 ENCOUNTER — Encounter: Payer: Self-pay | Admitting: Internal Medicine

## 2022-04-01 ENCOUNTER — Telehealth: Payer: Self-pay | Admitting: Cardiovascular Disease

## 2022-04-01 NOTE — Telephone Encounter (Signed)
  Pt is requesting to switch from Dr. Oval Linsey to Dr. Garen Lah because pt is closer to [facility] office

## 2022-04-01 NOTE — Telephone Encounter (Signed)
Please review.  KP

## 2022-04-14 NOTE — Telephone Encounter (Signed)
Received a call for RN calling for an update on provider switch.

## 2022-04-17 NOTE — Telephone Encounter (Signed)
LVM informing pt of switch. Told him to c/b to sch an appt.

## 2022-06-03 ENCOUNTER — Other Ambulatory Visit: Payer: Self-pay | Admitting: Internal Medicine

## 2022-06-03 DIAGNOSIS — I10 Essential (primary) hypertension: Secondary | ICD-10-CM

## 2022-06-03 MED ORDER — IRBESARTAN 300 MG PO TABS: 300.0000 mg | ORAL_TABLET | Freq: Every day | ORAL | 0 refills | Status: DC

## 2022-06-03 NOTE — Telephone Encounter (Signed)
Medication Refill - Medication: irbesartan (AVAPRO) 300 MG tablet   Has the patient contacted their pharmacy? Yes.   (Agent: If no, request that the patient contact the pharmacy for the refill. If patient does not wish to contact the pharmacy document the reason why and proceed with request.) (Agent: If yes, when and what did the pharmacy advise?)  Preferred Pharmacy (with phone number or street name):  [place], [place] - [place]  [address] [place] [address]  Phone: [phone] Fax: [phone]   Has the patient been seen for an appointment in the last year OR does the patient have an upcoming appointment? Yes.    Agent: Please be advised that RX refills may take up to 3 business days. We ask that you follow-up with your pharmacy.

## 2022-06-03 NOTE — Telephone Encounter (Signed)
Requested Prescriptions  Pending Prescriptions Disp Refills   irbesartan (AVAPRO) 300 MG tablet 90 tablet 0    Sig: Take 1 tablet (300 mg total) by mouth daily.     Cardiovascular:  Angiotensin Receptor Blockers Failed - 06/03/2022 12:22 PM      Failed - Cr in normal range and within 180 days    Creatinine, Ser  Date Value Ref Range Status  09/12/2021 0.98 0.61 - 1.24 mg/dL Final         Failed - K in normal range and within 180 days    Potassium  Date Value Ref Range Status  09/12/2021 3.6 3.5 - 5.1 mmol/L Final         Passed - Patient is not pregnant      Passed - Last BP in normal range    BP Readings from Last 1 Encounters:  02/20/22 129/79         Passed - Valid encounter within last 6 months    Recent Outpatient Visits           3 months ago Essential hypertension   [facility], Jesse Sans, MD   5 months ago Primary hypertension   [place] at [facility], Jesse Sans, MD   9 months ago Colon cancer screening   [place] at [facility], Jesse Sans, MD   1 year ago Essential hypertension   [place] at [facility], Jesse Sans, MD   1 year ago Facial cellulitis   [place] at [facility], Jesse Sans, MD       Future Appointments             In 2 weeks [facility] Melia, Jesse Sans, MD [place] at [facility], [facility]

## 2022-06-23 ENCOUNTER — Ambulatory Visit: Payer: BC Managed Care – PPO | Admitting: Internal Medicine

## 2022-07-28 ENCOUNTER — Ambulatory Visit: Payer: BC Managed Care – PPO | Admitting: Internal Medicine

## 2022-07-28 ENCOUNTER — Encounter: Payer: Self-pay | Admitting: Internal Medicine

## 2022-07-28 VITALS — BP 164/110 | HR 64 | Ht 72.0 in | Wt 242.0 lb

## 2022-07-28 DIAGNOSIS — S86912A Strain of unspecified muscle(s) and tendon(s) at lower leg level, left leg, initial encounter: Secondary | ICD-10-CM | POA: Diagnosis not present

## 2022-07-28 DIAGNOSIS — F39 Unspecified mood [affective] disorder: Secondary | ICD-10-CM | POA: Diagnosis not present

## 2022-07-28 DIAGNOSIS — S8392XA Sprain of unspecified site of left knee, initial encounter: Secondary | ICD-10-CM | POA: Insufficient documentation

## 2022-07-28 DIAGNOSIS — I10 Essential (primary) hypertension: Secondary | ICD-10-CM | POA: Diagnosis not present

## 2022-07-28 MED ORDER — DILTIAZEM HCL ER COATED BEADS 120 MG PO CP24: 120.0000 mg | ORAL_CAPSULE | Freq: Every day | ORAL | 1 refills | Status: DC

## 2022-07-28 NOTE — Progress Notes (Signed)
Date:  07/28/2022   Name:  Alexander Davis   DOB:  05/31/1960   MRN:  [id number]   Chief Complaint: Hypertension  Hypertension This is a chronic problem. The problem is uncontrolled. Pertinent negatives include no chest pain, headaches, palpitations or shortness of breath.  Knee Pain  The incident occurred more than 1 week ago. The incident occurred at home. The injury mechanism was a twisting injury. The pain is present in the left knee. The pain is mild. The pain has been Improving since onset.   Cardiomegaly on Xray - seen by Dr. Clayborn Bigness.  Per his note 05/22/22 Plan: Hypertension poor control currently would recommend adding amlodipine to his current regimen of spironolactone [place] Obesity recommend modest weight loss exercise portion control Bradycardia reasonably stable heart rate in the 60s asymptomatic continue current management Cardiomegaly recommend echocardiogram for assessment of cardiac size structures and valvular structures Anginal type symptoms shortness of breath anginal equivalent recommend exercise Myoview for further evaluation Echocardiogram for shortness of breath cardiomegaly evaluation History of small thoracic aneurysm will consider surveillance with CT or MRI Possible obstructive sleep apnea recommend sleep study CPAP if indicated refer to Dr. Loni Muse Have the patient follow-up in 1 month   He did not follow up with Dr. Etta Quill plan due to his mother in law's dx of pancreatic cancer and then recent death.  He will call to schedule the recommended tests.  Lab Results  Component Value Date   NA 140 09/12/2021   K 3.6 09/12/2021   CO2 26 09/12/2021   GLUCOSE 122 (H) 09/12/2021   BUN 10 09/12/2021   CREATININE 0.98 09/12/2021   CALCIUM 9.1 09/12/2021   GFRNONAA >60 09/12/2021   No results found for: "CHOL", "HDL", "LDLCALC", "LDLDIRECT", "TRIG", "CHOLHDL" Lab Results  Component Value Date   TSH 1.481 01/19/2017   No results found for:  "HGBA1C" Lab Results  Component Value Date   WBC 5.0 09/12/2021   HGB 12.4 (L) 09/12/2021   HCT 37.8 (L) 09/12/2021   MCV 95.5 09/12/2021   PLT 148 (L) 09/12/2021   Lab Results  Component Value Date   ALT 18 09/12/2021   AST 24 09/12/2021   ALKPHOS 92 09/12/2021   BILITOT 0.8 09/12/2021   No results found for: "25OHVITD2", "25OHVITD3", "VD25OH"   Review of Systems  Constitutional:  Negative for fatigue and unexpected weight change.  HENT:  Negative for nosebleeds.   Eyes:  Negative for visual disturbance.  Respiratory:  Negative for cough, chest tightness, shortness of breath and wheezing.   Cardiovascular:  Negative for chest pain, palpitations and leg swelling.  Gastrointestinal:  Negative for abdominal pain, constipation and diarrhea.  Musculoskeletal:  Positive for arthralgias (left knee pain and swelling).  Neurological:  Negative for dizziness, weakness, light-headedness and headaches.  Psychiatric/Behavioral:  Positive for dysphoric mood and sleep disturbance.     Patient Active Problem List   Diagnosis Date Noted   Mild cardiomegaly 02/20/2022   Aneurysm of ascending aorta without rupture 02/20/2022   Precordial chest pain 03/11/2021   Obesity 03/11/2021   Vertigo 11/11/2019   Family history of prostate cancer in father 01/19/2017   Pill dysphagia 11/18/2015   Essential hypertension 02/12/2015    Allergies  Allergen Reactions   Fish Allergy Anaphylaxis   Norvasc [Amlodipine] Swelling   Peanut-Containing Drug Products Anaphylaxis and Swelling    Lips swell   Penicillins Anaphylaxis    Has patient had a PCN reaction causing immediate rash, facial/tongue/throat swelling, SOB  or lightheadedness with hypotension: Unknown Has patient had a PCN reaction causing severe rash involving mucus membranes or skin necrosis: Unknown Has patient had a PCN reaction that required hospitalization: Unknown Has patient had a PCN reaction occurring within the last 10 years:  No If all of the above answers are "NO", then may proceed with Cephalosporin use.     Hctz [Hydrochlorothiazide] Other (See Comments)    Muscle cramps    Past Surgical History:  Procedure Laterality Date   HERNIA REPAIR     age 89    Social History   Tobacco Use   Smoking status: Never   Smokeless tobacco: Never  Vaping Use   Vaping Use: Never used  Substance Use Topics   Alcohol use: No    Alcohol/week: 0.0 standard drinks of alcohol   Drug use: No     Medication list has been reviewed and updated.  Current Meds  Medication Sig   diltiazem (CARDIZEM CD) 120 MG 24 hr capsule Take 1 capsule (120 mg total) by mouth daily.   irbesartan (AVAPRO) 300 MG tablet Take 1 tablet (300 mg total) by mouth daily.   [DISCONTINUED] amLODipine (NORVASC) 5 MG tablet Take by mouth.   [DISCONTINUED] spironolactone (ALDACTONE) 25 MG tablet Take 1 tablet (25 mg total) by mouth daily.       07/28/2022    4:04 PM 12/17/2021    1:41 PM 08/29/2021   11:22 AM 10/22/2020    9:54 AM  GAD 7 : Generalized Anxiety Score  Nervous, Anxious, on Edge 0 0 0 0  Control/stop worrying 0 0 0 0  Worry too much - different things 1 1 1 1   Trouble relaxing 0 0 0 0  Restless 0 0 0 0  Easily annoyed or irritable 0 0 0 1  Afraid - awful might happen 1 1 0 0  Total GAD 7 Score 2 2 1 2   Anxiety Difficulty Not difficult at all Not difficult at all Somewhat difficult        07/28/2022    4:04 PM 12/17/2021    1:41 PM 08/29/2021   11:22 AM  Depression screen PHQ 2/9  Decreased Interest 1 1 0  Down, Depressed, Hopeless 1 1 1   PHQ - 2 Score 2 2 1   Altered sleeping 1 0 0  Tired, decreased energy 1 1 1   Change in appetite 1 1 0  Feeling bad or failure about yourself  1 1 1   Trouble concentrating 0 0 0  Moving slowly or fidgety/restless 0 0 0  Suicidal thoughts 0 0 0  PHQ-9 Score 6 5 3   Difficult doing work/chores Somewhat difficult Not difficult at all Not difficult at all    BP Readings from Last 3  Encounters:  07/28/22 (!) 164/110  02/20/22 129/79  12/17/21 (!) 172/102    Physical Exam Vitals and nursing note reviewed.  Constitutional:      General: He is not in acute distress.    Appearance: Normal appearance. He is well-developed.  HENT:     Head: Normocephalic and atraumatic.  Pulmonary:     Effort: Pulmonary effort is normal. No respiratory distress.  Musculoskeletal:     Left knee: Swelling present. No erythema or crepitus. Normal range of motion. No tenderness.  Skin:    General: Skin is warm and dry.     Findings: No rash.  Neurological:     General: No focal deficit present.     Mental Status: He is alert and oriented  to person, place, and time.  Psychiatric:        Mood and Affect: Mood normal.        Behavior: Behavior normal.     Wt Readings from Last 3 Encounters:  07/28/22 242 lb (109.8 kg)  02/20/22 235 lb 12.8 oz (107 kg)  12/17/21 236 lb (107 kg)    BP (!) 164/110 (BP Location: Left Arm, Cuff Size: Large)   Pulse 64   Ht 6' (1.829 m)   Wt 242 lb (109.8 kg)   SpO2 95%   BMI 32.82 kg/m   Assessment and Plan:  Problem List Items Addressed This Visit       Cardiovascular and Mediastinum   Essential hypertension - Primary (Chronic)    HTN remains uncontrolled. He is on irbesartan 300 mg. spironolactone 25 mg stopped due to lightheadedness Cardiology added amlodipine - but he did not take it. Work up recommended for HTN/CM/shortness of breath anginal equivalent -  Will add Cardiazem low dose.  He may have some lightheadedness early in the treatment but it should be temporary. Follow up in 6 weeks He will contact Cardiology to get the tests scheduled      Relevant Medications   diltiazem (CARDIZEM CD) 120 MG 24 hr capsule   Other Visit Diagnoses     Mood disorder       He would like to seek counseling services to help with recent loss Recommend on-line services   Strain of left knee, initial encounter       Recommend rest, ice and  tylenol follow up if no improvement       Return in about 6 weeks (around 09/08/2022) for HTN.   Partially dictated using [place], any errors are not intentional.  Glean Hess, MD [place], [place]

## 2022-07-28 NOTE — Assessment & Plan Note (Addendum)
HTN remains uncontrolled. He is on irbesartan 300 mg. spironolactone 25 mg stopped due to lightheadedness Cardiology added amlodipine - but he did not take it. Work up recommended for HTN/CM/shortness of breath anginal equivalent -  Will add Cardiazem low dose.  He may have some lightheadedness early in the treatment but it should be temporary. Follow up in 6 weeks He will contact Cardiology to get the tests scheduled

## 2022-07-28 NOTE — Patient Instructions (Signed)
Contact Dr. Clayborn Bigness about the testing he recommended  ECHO and Stress Test

## 2022-07-30 ENCOUNTER — Telehealth: Payer: Self-pay

## 2022-07-30 ENCOUNTER — Telehealth: Payer: BC Managed Care – PPO | Admitting: Family Medicine

## 2022-07-30 ENCOUNTER — Ambulatory Visit: Payer: Self-pay

## 2022-07-30 NOTE — Telephone Encounter (Signed)
Patient returned the call and says he's not able to do a visit tomorrow due to a funeral. I advised a MyChart Video Visit for today is available with another provider if he's ok with that, he agreed, appt scheduled at 1320 with Dr. Zigmund Daniel. Instructions given how to start visit and the time.

## 2022-07-30 NOTE — Telephone Encounter (Signed)
PC to pt, pt stopped taking Diltiazem due to lip swelling.Denies SOB, current swelling, any other SX.  Pt would like something else called in to his pharmacy. Please advise

## 2022-07-30 NOTE — Telephone Encounter (Signed)
Summary: diltiazem (CARDIZEM CD) 120 MG 24 hr capsule, lip swelling   The patient called stating he cannot tolerate the medicine, diltiazem (CARDIZEM CD) 120 MG 24 hr capsule. He said it has caused his lip to swell up. He said it was like he had an allergic reaction. He doesn't know what to do at this point. Please assist patient further.      Called pt - left message on machine to call back.

## 2022-07-30 NOTE — Telephone Encounter (Signed)
Was trying to schedule appt when pt needed to terminate call d/t another call coming in. Pt may call back to schedule virtual OV    Chief Complaint: Upper lip swelling  - med reaction Symptoms: upper lip swelling Frequency: since Tuesday Pertinent Negatives: Patient denies  Disposition: [] ED /[] Urgent Care (no appt availability in office) / [x] Appointment(In office/virtual)/ []  [place] [facility]/ [] Home Care/ [] Refused Recommended Disposition /[] [place] [facility]/ []  Follow-up with PCP Additional Notes: PT took cardizem on Tuesday . When he took it it was stuck to his upper lip for a few minutes while he was talking to his wife. About 30 minutes after taking his upper lip began to swell where pill was stuck. Pt has no other s/s. Swelling is still there but resolving. Pt has not taken the medication again.  Was trying to schedule appt when pt needed to terminate call d/t another call coming in. Pt may call back to schedule virtual OV.   Summary: diltiazem (CARDIZEM CD) 120 MG 24 hr capsule, lip swelling   The patient called stating he cannot tolerate the medicine, diltiazem (CARDIZEM CD) 120 MG 24 hr capsule. He said it has caused his lip to swell up. He said it was like he had an allergic reaction. He doesn't know what to do at this point. Please assist patient further.     Reason for Disposition  Face swelling began after taking a drug  Answer Assessment - Initial Assessment Questions 1. ONSET: "When did the swelling start?" (e.g., minutes, hours, days)     Tuesday - pill was stuck to lip 2. LOCATION: "What part of the face is swollen?"     Lips - upper 3. SEVERITY: "How swollen is it?"     On the inside by teeth - still puffy 4. ITCHING: "Is there any itching?" If Yes, ask: "How much?"   (Scale 1-10; mild, moderate or severe)     no 5. PAIN: "Is the swelling painful to touch?" If Yes, ask: "How painful is it?"   (Scale 1-10; mild, moderate or severe)   - NONE (0): no  pain   - MILD (1-3): doesn't interfere with normal activities    - MODERATE (4-7): interferes with normal activities or awakens from sleep    - SEVERE (8-10): excruciating pain, unable to do any normal activities      irritant 6. FEVER: "Do you have a fever?" If Yes, ask: "What is it, how was it measured, and when did it start?"      no 7. CAUSE: "What do you think is causing the face swelling?"     Med reaction 8. RECURRENT SYMPTOM: "Have you had face swelling before?" If Yes, ask: "When was the last time?" "What happened that time?"     no 9. OTHER SYMPTOMS: "Do you have any other symptoms?" (e.g., toothache, leg swelling)     Knee is swollen. Left knee problems  Protocols used: Face Swelling-A-AH

## 2022-07-31 ENCOUNTER — Other Ambulatory Visit: Payer: Self-pay | Admitting: Internal Medicine

## 2022-07-31 DIAGNOSIS — I10 Essential (primary) hypertension: Secondary | ICD-10-CM

## 2022-07-31 MED ORDER — CLONIDINE 0.1 MG/24HR TD PTWK: 0.1000 mg | MEDICATED_PATCH | TRANSDERMAL | 1 refills | Status: DC

## 2022-07-31 NOTE — Telephone Encounter (Signed)
Called patient and left VM asking him to call back about new medication and instructions.  Alexander Davis

## 2022-08-03 NOTE — Telephone Encounter (Signed)
Called and left Vm asking pt to call back about medication change and instructions for the medicine. If pt returns call, PEC may give Dr Karn Cassis instructions.

## 2022-08-04 ENCOUNTER — Ambulatory Visit: Payer: Self-pay | Admitting: *Deleted

## 2022-08-04 NOTE — Telephone Encounter (Signed)
  Chief Complaint: Called in and was given Dr. Karn Cassis message Symptoms:  Frequency:  Pertinent Negatives: Patient denies  Disposition: [] ED /[] Urgent Care (no appt availability in office) / [] Appointment(In office/virtual)/ []  [place] [facility]/ [] Home Care/ [] Refused Recommended Disposition /[] [place] [facility]/ []  Follow-up with PCP Additional Notes:

## 2022-08-04 NOTE — Telephone Encounter (Signed)
Reason for Disposition  [1] Follow-up call to recent contact AND [2] information only call, no triage required  Answer Assessment - Initial Assessment Questions 1. REASON FOR CALL or QUESTION: "What is your reason for calling today?" or "How can I best help you?" or "What question do you have that I can help answer?"     Pt returned call and was given the medication message from Dr. Judithann Graves dated 07/31/2022 at 1:31 PM. He was agreeable to trying the patch and is going to pick it up now from the pharmacy.  Protocols used: Information Only Call - No Triage-A-AH

## 2022-09-08 ENCOUNTER — Ambulatory Visit: Payer: BC Managed Care – PPO | Admitting: Internal Medicine

## 2022-09-15 ENCOUNTER — Ambulatory Visit: Payer: BC Managed Care – PPO | Admitting: Internal Medicine

## 2022-09-21 ENCOUNTER — Other Ambulatory Visit: Payer: Self-pay | Admitting: Internal Medicine

## 2022-09-21 DIAGNOSIS — I10 Essential (primary) hypertension: Secondary | ICD-10-CM

## 2022-09-23 ENCOUNTER — Ambulatory Visit: Payer: BC Managed Care – PPO | Admitting: Internal Medicine

## 2022-09-23 NOTE — Progress Notes (Deleted)
Date:  09/23/2022   Name:  Alexander Davis   DOB:  [birth year]   MRN:  [id number]   Chief Complaint: No chief complaint on file.  Hypertension This is a chronic problem. The problem is uncontrolled. Past treatments include angiotensin blockers (and clonidine patch added last visit).    Lab Results  Component Value Date   NA 140 09/12/2021   K 3.6 09/12/2021   CO2 26 09/12/2021   GLUCOSE 122 (H) 09/12/2021   BUN 10 09/12/2021   CREATININE 0.98 09/12/2021   CALCIUM 9.1 09/12/2021   GFRNONAA >60 09/12/2021   No results found for: "CHOL", "HDL", "LDLCALC", "LDLDIRECT", "TRIG", "CHOLHDL" Lab Results  Component Value Date   TSH 1.481 01/19/2017   No results found for: "HGBA1C" Lab Results  Component Value Date   WBC 5.0 09/12/2021   HGB 12.4 (L) 09/12/2021   HCT 37.8 (L) 09/12/2021   MCV 95.5 09/12/2021   PLT 148 (L) 09/12/2021   Lab Results  Component Value Date   ALT 18 09/12/2021   AST 24 09/12/2021   ALKPHOS 92 09/12/2021   BILITOT 0.8 09/12/2021   No results found for: "25OHVITD2", "25OHVITD3", "VD25OH"   Review of Systems  Patient Active Problem List   Diagnosis Date Noted   Mild cardiomegaly 02/20/2022   Aneurysm of ascending aorta without rupture (HCC) 02/20/2022   Precordial chest pain 03/11/2021   Obesity 03/11/2021   Vertigo 11/11/2019   Family history of prostate cancer in father 01/19/2017   Pill dysphagia 11/18/2015   Essential hypertension 02/12/2015    Allergies  Allergen Reactions   Diltiazem Swelling    Lip swelling   Fish Allergy Anaphylaxis   Norvasc [Amlodipine] Swelling   Peanut-Containing Drug Products Anaphylaxis and Swelling    Lips swell   Penicillins Anaphylaxis    Has patient had a PCN reaction causing immediate rash, facial/tongue/throat swelling, SOB or lightheadedness with hypotension: Unknown Has patient had a PCN reaction causing severe rash involving mucus membranes or skin necrosis: Unknown Has patient had a  PCN reaction that required hospitalization: Unknown Has patient had a PCN reaction occurring within the last 10 years: No If all of the above answers are "NO", then may proceed with Cephalosporin use.     Hctz [Hydrochlorothiazide] Other (See Comments)    Muscle cramps    Past Surgical History:  Procedure Laterality Date   HERNIA REPAIR     age 55    Social History   Tobacco Use   Smoking status: Never   Smokeless tobacco: Never  Vaping Use   Vaping Use: Never used  Substance Use Topics   Alcohol use: No    Alcohol/week: 0.0 standard drinks of alcohol   Drug use: No     Medication list has been reviewed and updated.  No outpatient medications have been marked as taking for the 09/23/22 encounter (Appointment) with Reubin Milan, MD.       07/28/2022    4:04 PM 12/17/2021    1:41 PM 08/29/2021   11:22 AM 10/22/2020    9:54 AM  GAD 7 : Generalized Anxiety Score  Nervous, Anxious, on Edge 0 0 0 0  Control/stop worrying 0 0 0 0  Worry too much - different things 1 1 1 1   Trouble relaxing 0 0 0 0  Restless 0 0 0 0  Easily annoyed or irritable 0 0 0 1  Afraid - awful might happen 1 1 0 0  Total GAD 7 Score  2 2 1 2   Anxiety Difficulty Not difficult at all Not difficult at all Somewhat difficult        07/28/2022    4:04 PM 12/17/2021    1:41 PM 08/29/2021   11:22 AM  Depression screen PHQ 2/9  Decreased Interest 1 1 0  Down, Depressed, Hopeless 1 1 1   PHQ - 2 Score 2 2 1   Altered sleeping 1 0 0  Tired, decreased energy 1 1 1   Change in appetite 1 1 0  Feeling bad or failure about yourself  1 1 1   Trouble concentrating 0 0 0  Moving slowly or fidgety/restless 0 0 0  Suicidal thoughts 0 0 0  PHQ-9 Score 6 5 3   Difficult doing work/chores Somewhat difficult Not difficult at all Not difficult at all    BP Readings from Last 3 Encounters:  07/28/22 (!) 164/110  02/20/22 129/79  12/17/21 (!) 172/102    Physical Exam  Wt Readings from Last 3 Encounters:   07/28/22 242 lb (109.8 kg)  02/20/22 235 lb 12.8 oz (107 kg)  12/17/21 236 lb (107 kg)    There were no vitals taken for this visit.  Assessment and Plan:  Problem List Items Addressed This Visit   None   No follow-ups on file.   Partially dictated using Dragon software, any errors are not intentional.  Reubin Milan, MD [facility] [place], [place]

## 2022-10-14 ENCOUNTER — Ambulatory Visit: Payer: BC Managed Care – PPO | Admitting: Internal Medicine

## 2022-10-14 ENCOUNTER — Ambulatory Visit (INDEPENDENT_AMBULATORY_CARE_PROVIDER_SITE_OTHER): Payer: BC Managed Care – PPO | Admitting: Internal Medicine

## 2022-10-14 ENCOUNTER — Encounter: Payer: Self-pay | Admitting: Internal Medicine

## 2022-10-14 VITALS — BP 128/80 | HR 91 | Ht 72.0 in | Wt 243.0 lb

## 2022-10-14 DIAGNOSIS — I10 Essential (primary) hypertension: Secondary | ICD-10-CM | POA: Diagnosis not present

## 2022-10-14 DIAGNOSIS — R002 Palpitations: Secondary | ICD-10-CM

## 2022-10-14 MED ORDER — CLONIDINE 0.1 MG/24HR TD PTWK: 0.1000 mg | MEDICATED_PATCH | TRANSDERMAL | 1 refills | Status: DC

## 2022-10-14 NOTE — Progress Notes (Signed)
Date:  10/14/2022   Name:  Alexander Davis   DOB:  02/28/1961   MRN:  [id number]   Chief Complaint: Hypertension  Hypertension This is a chronic problem. The problem has been gradually improving since onset. The problem is controlled. Associated symptoms include palpitations (hollow feeling in chest at times - may be skipped beats). Pertinent negatives include no chest pain or shortness of breath. Treatments tried: clondine patch is being tolerated after failing multiple other meds. There is no history of kidney disease, CAD/MI or CVA.    Lab Results  Component Value Date   NA 140 09/12/2021   K 3.6 09/12/2021   CO2 26 09/12/2021   GLUCOSE 122 (H) 09/12/2021   BUN 10 09/12/2021   CREATININE 0.98 09/12/2021   CALCIUM 9.1 09/12/2021   GFRNONAA >60 09/12/2021   No results found for: "CHOL", "HDL", "LDLCALC", "LDLDIRECT", "TRIG", "CHOLHDL" Lab Results  Component Value Date   TSH 1.481 01/19/2017   No results found for: "HGBA1C" Lab Results  Component Value Date   WBC 5.0 09/12/2021   HGB 12.4 (L) 09/12/2021   HCT 37.8 (L) 09/12/2021   MCV 95.5 09/12/2021   PLT 148 (L) 09/12/2021   Lab Results  Component Value Date   ALT 18 09/12/2021   AST 24 09/12/2021   ALKPHOS 92 09/12/2021   BILITOT 0.8 09/12/2021   No results found for: "25OHVITD2", "25OHVITD3", "VD25OH"   Review of Systems  Constitutional:  Negative for chills, fatigue and fever.  Respiratory:  Negative for cough, chest tightness and shortness of breath.   Cardiovascular:  Positive for palpitations (hollow feeling in chest at times - may be skipped beats). Negative for chest pain and leg swelling.  Psychiatric/Behavioral:  Negative for dysphoric mood and sleep disturbance. The patient is nervous/anxious.     Patient Active Problem List   Diagnosis Date Noted   Mild cardiomegaly 02/20/2022   Aneurysm of ascending aorta without rupture (HCC) 02/20/2022   Precordial chest pain 03/11/2021   Obesity  03/11/2021   Vertigo 11/11/2019   Family history of prostate cancer in father 01/19/2017   Pill dysphagia 11/18/2015   Essential hypertension 02/12/2015    Allergies  Allergen Reactions   Diltiazem Swelling    Lip swelling   Fish Allergy Anaphylaxis   Norvasc [Amlodipine] Swelling   Peanut-Containing Drug Products Anaphylaxis and Swelling    Lips swell   Penicillins Anaphylaxis    Has patient had a PCN reaction causing immediate rash, facial/tongue/throat swelling, SOB or lightheadedness with hypotension: Unknown Has patient had a PCN reaction causing severe rash involving mucus membranes or skin necrosis: Unknown Has patient had a PCN reaction that required hospitalization: Unknown Has patient had a PCN reaction occurring within the last 10 years: No If all of the above answers are "NO", then may proceed with Cephalosporin use.     Hctz [Hydrochlorothiazide] Other (See Comments)    Muscle cramps    Past Surgical History:  Procedure Laterality Date   HERNIA REPAIR     age 31    Social History   Tobacco Use   Smoking status: Never   Smokeless tobacco: Never  Vaping Use   Vaping Use: Never used  Substance Use Topics   Alcohol use: No    Alcohol/week: 0.0 standard drinks of alcohol   Drug use: No     Medication list has been reviewed and updated.  Current Meds  Medication Sig   Ascorbic Acid (VITAMIN C PO) Take 1 tablet  by mouth daily.   Glucosamine-Chondroitin-MSM 500-200-150 MG TABS Take by mouth.   irbesartan (AVAPRO) 300 MG tablet Take 1 tablet (300 mg total) by mouth daily.   Multiple Vitamins-Minerals (MENS MULTIVITAMIN PO) Take 1 tablet by mouth daily.   Nettle, Urtica Dioica, (NETTLE LEAF PO) Take by mouth.   OLIVE LEAF PO Take by mouth.   [DISCONTINUED] cloNIDine (CATAPRES - DOSED IN MG/24 HR) 0.1 mg/24hr patch PLACE 1 PATCH ONTO THE SKIN ONCE A WEEK       10/14/2022    2:31 PM 07/28/2022    4:04 PM 12/17/2021    1:41 PM 08/29/2021   11:22 AM  GAD 7 :  Generalized Anxiety Score  Nervous, Anxious, on Edge 1 0 0 0  Control/stop worrying 1 0 0 0  Worry too much - different things 1 1 1 1   Trouble relaxing 0 0 0 0  Restless 0 0 0 0  Easily annoyed or irritable 0 0 0 0  Afraid - awful might happen 0 1 1 0  Total GAD 7 Score 3 2 2 1   Anxiety Difficulty Not difficult at all Not difficult at all Not difficult at all Somewhat difficult       10/14/2022    2:31 PM 07/28/2022    4:04 PM 12/17/2021    1:41 PM  Depression screen PHQ 2/9  Decreased Interest 0 1 1  Down, Depressed, Hopeless 0 1 1  PHQ - 2 Score 0 2 2  Altered sleeping 0 1 0  Tired, decreased energy 0 1 1  Change in appetite 0 1 1  Feeling bad or failure about yourself  0 1 1  Trouble concentrating 0 0 0  Moving slowly or fidgety/restless 0 0 0  Suicidal thoughts 0 0 0  PHQ-9 Score 0 6 5  Difficult doing work/chores Not difficult at all Somewhat difficult Not difficult at all    BP Readings from Last 3 Encounters:  10/14/22 128/80  07/28/22 (!) 164/110  02/20/22 129/79    Physical Exam Vitals and nursing note reviewed.  Constitutional:      General: He is not in acute distress.    Appearance: Normal appearance. He is well-developed.  HENT:     Head: Normocephalic and atraumatic.  Neck:     Vascular: No carotid bruit.  Cardiovascular:     Rate and Rhythm: Normal rate and regular rhythm.     Pulses: Normal pulses.     Heart sounds: No murmur heard. Pulmonary:     Effort: Pulmonary effort is normal. No respiratory distress.     Breath sounds: No wheezing or rhonchi.  Musculoskeletal:     Cervical back: Normal range of motion.  Lymphadenopathy:     Cervical: No cervical adenopathy.  Skin:    General: Skin is warm and dry.     Findings: No rash.  Neurological:     Mental Status: He is alert and oriented to person, place, and time.  Psychiatric:        Mood and Affect: Mood normal.        Behavior: Behavior normal.     Wt Readings from Last 3 Encounters:   10/14/22 243 lb (110.2 kg)  07/28/22 242 lb (109.8 kg)  02/20/22 235 lb 12.8 oz (107 kg)    BP 128/80   Pulse 91   Ht 6' (1.829 m)   Wt 243 lb (110.2 kg)   SpO2 98%   BMI 32.96 kg/m   Assessment and Plan:  Problem List Items Addressed This Visit     Essential hypertension - Primary (Chronic)    Stable exam with well controlled BP.  Currently taking clonidine patch. He is able to tolerate it well.  He is also exercising and limiting his sodium intake. He will continue low sodium diet and current regimen. Follow up in 4 mo for CPX       Relevant Medications   cloNIDine (CATAPRES - DOSED IN MG/24 HR) 0.1 mg/24hr patch   Other Visit Diagnoses     Palpitations       per patient, symptoms occur when he is anxious no worrisome features noted - follow up if worsening       Return in about 4 months (around 02/13/2023) for CPX.   Partially dictated using Dragon software, any errors are not intentional.  Reubin Milan, MD [facility] [place], [place]

## 2022-10-14 NOTE — Assessment & Plan Note (Addendum)
Stable exam with well controlled BP.  Currently taking clonidine patch. He is able to tolerate it well.  He is also exercising and limiting his sodium intake. He will continue low sodium diet and current regimen. Follow up in 4 mo for CPX

## 2022-12-22 DIAGNOSIS — I252 Old myocardial infarction: Secondary | ICD-10-CM | POA: Insufficient documentation

## 2022-12-22 DIAGNOSIS — Z8674 Personal history of sudden cardiac arrest: Secondary | ICD-10-CM | POA: Insufficient documentation

## 2022-12-22 DIAGNOSIS — I472 Ventricular tachycardia, unspecified: Secondary | ICD-10-CM | POA: Insufficient documentation

## 2022-12-22 DIAGNOSIS — Z8709 Personal history of other diseases of the respiratory system: Secondary | ICD-10-CM | POA: Insufficient documentation

## 2022-12-24 ENCOUNTER — Telehealth: Payer: Self-pay | Admitting: *Deleted

## 2022-12-24 NOTE — Telephone Encounter (Signed)
[facility] Calling to verify patient's current medications- verified active medications current on list

## 2022-12-27 DIAGNOSIS — G931 Anoxic brain damage, not elsewhere classified: Secondary | ICD-10-CM | POA: Insufficient documentation

## 2022-12-27 DIAGNOSIS — R569 Unspecified convulsions: Secondary | ICD-10-CM | POA: Insufficient documentation

## 2023-01-19 DIAGNOSIS — R627 Adult failure to thrive: Secondary | ICD-10-CM | POA: Insufficient documentation

## 2023-02-05 ENCOUNTER — Telehealth: Payer: Self-pay | Admitting: Internal Medicine

## 2023-02-05 NOTE — Telephone Encounter (Signed)
Margarita Rana is calling from [facility]. Dx CB- [phone]

## 2023-02-05 NOTE — Telephone Encounter (Signed)
Number documented is [facility] but no extension was left to call Kittha back. Unable to speak with her. Also, this message does not have enough information in it for [place] to know what this is about.  - Sherilyn Windhorst

## 2023-02-09 ENCOUNTER — Ambulatory Visit: Payer: BC Managed Care – PPO | Admitting: Internal Medicine

## 2023-03-17 ENCOUNTER — Encounter: Payer: BC Managed Care – PPO | Admitting: Internal Medicine

## 2023-05-19 DIAGNOSIS — R079 Chest pain, unspecified: Secondary | ICD-10-CM | POA: Diagnosis not present

## 2023-06-22 LAB — CBC AND DIFFERENTIAL
HCT: 35 — AB (ref 41–53)
Hemoglobin: 11.5 — AB (ref 13.5–17.5)
Platelets: 118 10*3/uL — AB (ref 150–400)
WBC: 6

## 2023-06-22 LAB — BASIC METABOLIC PANEL
BUN: 19 (ref 4–21)
CO2: 33 — AB (ref 13–22)
Chloride: 108 (ref 99–108)
Creatinine: 0.7 (ref 0.6–1.3)
Glucose: 100
Potassium: 3.7 meq/L (ref 3.5–5.1)
Sodium: 142 (ref 137–147)

## 2023-06-22 LAB — COMPREHENSIVE METABOLIC PANEL
Calcium: 8.3 — AB (ref 8.7–10.7)
eGFR: >60

## 2023-06-24 ENCOUNTER — Telehealth: Payer: Self-pay

## 2023-06-24 ENCOUNTER — Other Ambulatory Visit: Payer: Self-pay | Admitting: Internal Medicine

## 2023-06-24 ENCOUNTER — Encounter: Payer: Self-pay | Admitting: Internal Medicine

## 2023-06-24 NOTE — Telephone Encounter (Signed)
 Copied from CRM [id number]. Topic: Appointments - Appointment Scheduling >> Jun 24, 2023 11:55 AM Ivette P wrote: Patient/patient representative is calling to schedule an appointment. Refer to attachments for appointment information.    Social worker Leim Fabry Wuo called in to schedule hospital follow up visit for pt. Called CAL to verify that if Video visit could be scheduled for Hospital Follow up because Hospital Follow up are always scheduled in person. CAL verified with Dr. Judithann Graves and was told to follow up with [facility], Pt has not been referred to a [facility] and does not have a Environmental education officer.    Nurse Social Worker is requesting a follow up call to be able to schedule pt for a Hospital follow up visit.   Leim Fabry [facility] callback [phone]

## 2023-06-24 NOTE — Progress Notes (Unsigned)
 Date:  06/24/2023   Name:  Alexander Davis   DOB:  [birth year]   MRN:  [id number]   Chief Complaint: No chief complaint on file.  HPI  Review of Systems   Lab Results  Component Value Date   NA 140 09/12/2021   K 3.6 09/12/2021   CO2 26 09/12/2021   GLUCOSE 122 (H) 09/12/2021   BUN 10 09/12/2021   CREATININE 0.98 09/12/2021   CALCIUM 9.1 09/12/2021   GFRNONAA >60 09/12/2021   No results found for: "CHOL", "HDL", "LDLCALC", "LDLDIRECT", "TRIG", "CHOLHDL" Lab Results  Component Value Date   TSH 1.481 01/19/2017   No results found for: "HGBA1C" Lab Results  Component Value Date   WBC 5.0 09/12/2021   HGB 12.4 (L) 09/12/2021   HCT 37.8 (L) 09/12/2021   MCV 95.5 09/12/2021   PLT 148 (L) 09/12/2021   Lab Results  Component Value Date   ALT 18 09/12/2021   AST 24 09/12/2021   ALKPHOS 92 09/12/2021   BILITOT 0.8 09/12/2021   No results found for: "25OHVITD2", "25OHVITD3", "VD25OH"   Patient Active Problem List   Diagnosis Date Noted   Failure to thrive in adult 01/19/2023   Anoxic encephalopathy (HCC) 12/27/2022   Seizures (HCC) 12/27/2022   Ventricular tachycardia (HCC) 12/22/2022   Mild cardiomegaly 02/20/2022   Aneurysm of ascending aorta without rupture (HCC) 02/20/2022   Precordial chest pain 03/11/2021   Obesity 03/11/2021   Vertigo 11/11/2019   Family history of prostate cancer in father 01/19/2017   Pill dysphagia 11/18/2015   Essential hypertension 02/12/2015    Allergies  Allergen Reactions   Diltiazem Swelling    Lip swelling   Fish Allergy Anaphylaxis   Norvasc [Amlodipine] Swelling   Peanut-Containing Drug Products Anaphylaxis and Swelling    Lips swell   Penicillins Anaphylaxis    Has patient had a PCN reaction causing immediate rash, facial/tongue/throat swelling, SOB or lightheadedness with hypotension: Unknown Has patient had a PCN reaction causing severe rash involving mucus membranes or skin necrosis: Unknown Has patient had a  PCN reaction that required hospitalization: Unknown Has patient had a PCN reaction occurring within the last 10 years: No If all of the above answers are "NO", then may proceed with Cephalosporin use.     Hctz [Hydrochlorothiazide] Other (See Comments)    Muscle cramps    Past Surgical History:  Procedure Laterality Date   HERNIA REPAIR     age 34    Social History   Tobacco Use   Smoking status: Never   Smokeless tobacco: Never  Vaping Use   Vaping status: Never Used  Substance Use Topics   Alcohol use: No    Alcohol/week: 0.0 standard drinks of alcohol   Drug use: No     Medication list has been reviewed and updated.  No outpatient medications have been marked as taking for the 06/24/23 encounter (Orders Only) with Reubin Milan, MD.       10/14/2022    2:31 PM 07/28/2022    4:04 PM 12/17/2021    1:41 PM 08/29/2021   11:22 AM  GAD 7 : Generalized Anxiety Score  Nervous, Anxious, on Edge 1 0 0 0  Control/stop worrying 1 0 0 0  Worry too much - different things 1 1 1 1   Trouble relaxing 0 0 0 0  Restless 0 0 0 0  Easily annoyed or irritable 0 0 0 0  Afraid - awful might happen 0 1 1 0  Total GAD 7 Score 3 2 2 1   Anxiety Difficulty Not difficult at all Not difficult at all Not difficult at all Somewhat difficult       10/14/2022    2:31 PM 07/28/2022    4:04 PM 12/17/2021    1:41 PM  Depression screen PHQ 2/9  Decreased Interest 0 1 1  Down, Depressed, Hopeless 0 1 1  PHQ - 2 Score 0 2 2  Altered sleeping 0 1 0  Tired, decreased energy 0 1 1  Change in appetite 0 1 1  Feeling bad or failure about yourself  0 1 1  Trouble concentrating 0 0 0  Moving slowly or fidgety/restless 0 0 0  Suicidal thoughts 0 0 0  PHQ-9 Score 0 6 5  Difficult doing work/chores Not difficult at all Somewhat difficult Not difficult at all    BP Readings from Last 3 Encounters:  10/14/22 128/80  07/28/22 (!) 164/110  02/20/22 129/79    Physical Exam  Wt Readings from Last 3  Encounters:  10/14/22 243 lb (110.2 kg)  07/28/22 242 lb (109.8 kg)  02/20/22 235 lb 12.8 oz (107 kg)    There were no vitals taken for this visit.  Assessment and Plan:  Problem List Items Addressed This Visit   None   No follow-ups on file.    Reubin Milan, MD [facility] [place]

## 2023-06-28 ENCOUNTER — Ambulatory Visit: Payer: BC Managed Care – PPO | Admitting: Internal Medicine

## 2023-06-28 ENCOUNTER — Encounter: Payer: Self-pay | Admitting: Internal Medicine

## 2023-06-28 DIAGNOSIS — I5022 Chronic systolic (congestive) heart failure: Secondary | ICD-10-CM | POA: Insufficient documentation

## 2023-06-28 DIAGNOSIS — I509 Heart failure, unspecified: Secondary | ICD-10-CM | POA: Insufficient documentation

## 2023-06-30 ENCOUNTER — Telehealth: Payer: BC Managed Care – PPO | Admitting: Internal Medicine

## 2023-06-30 ENCOUNTER — Encounter: Payer: Self-pay | Admitting: Internal Medicine

## 2023-06-30 VITALS — Ht 72.0 in | Wt 243.0 lb

## 2023-06-30 DIAGNOSIS — G931 Anoxic brain damage, not elsewhere classified: Secondary | ICD-10-CM | POA: Diagnosis not present

## 2023-06-30 DIAGNOSIS — Z931 Gastrostomy status: Secondary | ICD-10-CM | POA: Insufficient documentation

## 2023-06-30 DIAGNOSIS — I5022 Chronic systolic (congestive) heart failure: Secondary | ICD-10-CM | POA: Diagnosis not present

## 2023-06-30 DIAGNOSIS — I10 Essential (primary) hypertension: Secondary | ICD-10-CM | POA: Diagnosis not present

## 2023-06-30 DIAGNOSIS — R569 Unspecified convulsions: Secondary | ICD-10-CM

## 2023-06-30 DIAGNOSIS — Z930 Tracheostomy status: Secondary | ICD-10-CM

## 2023-06-30 NOTE — Progress Notes (Signed)
 Date:  06/30/2023   Name:  Alexander Davis   DOB:  [birth year]   MRN:  [id number]  This encounter was conducted via video encounter. This platform was deemed appropriate for the issues to be addressed.  The patient was correctly identified.  I advised that I am conducting the visit from a secure room in my office at [facility] clinic.  The patient is located at home. The limitations of this form of encounter were discussed with the patient's wife Helmut Muster and he/she agreed to proceed.  Some vital signs will be absent. She has home health with [facility] - but they have not come out. She has a 14 day delay on getting PDN.  Chief Complaint: Hospitalization Follow-up and Home Health (Wife would like to know if she can request home health and supplies )  Hypertension This is a chronic problem. The problem is controlled. Past treatments include diuretics, beta blockers and calcium channel blockers. The current treatment provides significant improvement. Hypertensive end-organ damage includes CAD/MI.  Hyperlipidemia This is a chronic problem. The problem is controlled. Current antihyperlipidemic treatment includes statins.  Heart Problem This is a chronic problem. The problem has been unchanged.  Seizures  This is a new problem. The problem has been resolved.  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  He is on statin and Effient.  No indication for cardiology evaluation at this time.   Review of Systems  Gastrointestinal:  Negative for blood in stool.  Genitourinary:  Negative for hematuria.  Skin:  Negative for color change and wound.  Neurological:  Negative for seizures.     Lab Results  Component Value Date   NA 142 06/22/2023   K 3.7 06/22/2023   CO2 33 (A) 06/22/2023   GLUCOSE 122 (H) 09/12/2021   BUN 19 06/22/2023   CREATININE 0.7 06/22/2023   CALCIUM 8.3 (A) 06/22/2023   EGFR >60 06/22/2023   GFRNONAA >60 09/12/2021   No  results found for: "CHOL", "HDL", "LDLCALC", "LDLDIRECT", "TRIG", "CHOLHDL" Lab Results  Component Value Date   TSH 1.481 01/19/2017   No results found for: "HGBA1C" Lab Results  Component Value Date   WBC 6.0 06/22/2023   HGB 11.5 (A) 06/22/2023   HCT 35 (A) 06/22/2023   MCV 95.5 09/12/2021   PLT 118 (A) 06/22/2023   Lab Results  Component Value Date   ALT 18 09/12/2021   AST 24 09/12/2021   ALKPHOS 92 09/12/2021   BILITOT 0.8 09/12/2021   No results found for: "25OHVITD2", "25OHVITD3", "VD25OH"   Patient Active Problem List   Diagnosis Date Noted   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/30/2023   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) 06/30/2023   Chronic systolic heart failure (HCC) 06/28/2023   Failure to thrive in adult 01/19/2023   Anoxic encephalopathy (HCC) 12/27/2022   Seizures (HCC) 12/27/2022   Ventricular tachycardia (HCC) 12/22/2022   Hx of respiratory failure 12/22/2022   Hx of cardiac arrest 12/22/2022   Hx of ST elevation myocardial infarction 12/22/2022   Mild cardiomegaly 02/20/2022   Aneurysm of ascending aorta without rupture (HCC) 02/20/2022   Vertigo 11/11/2019   Family history of prostate cancer in father 01/19/2017   Essential hypertension 02/12/2015    Allergies  Allergen Reactions   Diltiazem Swelling    Lip swelling   Fish Allergy Anaphylaxis   Norvasc [Amlodipine] Swelling   Peanut-Containing Drug Products Anaphylaxis and Swelling    Lips swell  Penicillins Anaphylaxis    Has patient had a PCN reaction causing immediate rash, facial/tongue/throat swelling, SOB or lightheadedness with hypotension: Unknown Has patient had a PCN reaction causing severe rash involving mucus membranes or skin necrosis: Unknown Has patient had a PCN reaction that required hospitalization: Unknown Has patient had a PCN reaction occurring within the last 10 years: No If all of the above answers are "NO", then may proceed with Cephalosporin use.     Hctz  [Hydrochlorothiazide] Other (See Comments)    Muscle cramps    Past Surgical History:  Procedure Laterality Date   HERNIA REPAIR     age 6    Social History   Tobacco Use   Smoking status: Never   Smokeless tobacco: Never  Vaping Use   Vaping status: Never Used  Substance Use Topics   Alcohol use: No    Alcohol/week: 0.0 standard drinks of alcohol   Drug use: No     Medication list has been reviewed and updated.  Current Meds  Medication Sig   acetaminophen (TYLENOL) 325 MG tablet Place 650 mg into feeding tube every 6 (six) hours as needed.   amLODipine (NORVASC) 5 MG tablet Place 5 mg into feeding tube every 12 (twelve) hours.   atorvastatin (LIPITOR) 80 MG tablet Place 80 mg into feeding tube daily.   carvedilol (COREG) 3.125 MG tablet Place 6.25 mg into feeding tube in the morning and at bedtime.   furosemide (LASIX) 40 MG tablet Place 40 mg into feeding tube daily.   lactulose (CHRONULAC) 10 GM/15ML solution Place 20 g into feeding tube daily.   levETIRAcetam (KEPPRA) 100 MG/ML solution Place 1,000 mg into feeding tube every 12 (twelve) hours.   Nutritional Supplements (FEEDING SUPPLEMENT, OSMOLITE 1.5 CAL,) LIQD Place 65 mLs into feeding tube continuous.   OVER THE COUNTER MEDICATION Place 20 mg into feeding tube every 6 (six) hours. Maxtussin DM  10 mg -100 mg/5 ml oral solution   prasugrel (EFFIENT) 10 MG TABS tablet Place 10 mg into feeding tube daily.   sennosides (SENOKOT) 8.8 MG/5ML syrup Place 5 mLs into feeding tube 2 (two) times daily.   valproic acid (DEPAKENE) 250 MG/5ML solution Place 250 mg into feeding tube every 6 (six) hours.       10/14/2022    2:31 PM 07/28/2022    4:04 PM 12/17/2021    1:41 PM 08/29/2021   11:22 AM  GAD 7 : Generalized Anxiety Score  Nervous, Anxious, on Edge 1 0 0 0  Control/stop worrying 1 0 0 0  Worry too much - different things 1 1 1 1   Trouble relaxing 0 0 0 0  Restless 0 0 0 0  Easily annoyed or irritable 0 0 0 0  Afraid  - awful might happen 0 1 1 0  Total GAD 7 Score 3 2 2 1   Anxiety Difficulty Not difficult at all Not difficult at all Not difficult at all Somewhat difficult       10/14/2022    2:31 PM 07/28/2022    4:04 PM 12/17/2021    1:41 PM  Depression screen PHQ 2/9  Decreased Interest 0 1 1  Down, Depressed, Hopeless 0 1 1  PHQ - 2 Score 0 2 2  Altered sleeping 0 1 0  Tired, decreased energy 0 1 1  Change in appetite 0 1 1  Feeling bad or failure about yourself  0 1 1  Trouble concentrating 0 0 0  Moving slowly or fidgety/restless 0  0 0  Suicidal thoughts 0 0 0  PHQ-9 Score 0 6 5  Difficult doing work/chores Not difficult at all Somewhat difficult Not difficult at all    BP Readings from Last 3 Encounters:  10/14/22 128/80  07/28/22 (!) 164/110  02/20/22 129/79    Physical Exam not performed due to video visit and patient unable to participate due to vegetative state.  Wt Readings from Last 3 Encounters:  06/30/23 243 lb (110.2 kg)  10/14/22 243 lb (110.2 kg)  07/28/22 242 lb (109.8 kg)    Ht 6' (1.829 m)   Wt 243 lb (110.2 kg)   BMI 32.96 kg/m   Assessment and Plan:  Problem List Items Addressed This Visit       Unprioritized   Essential hypertension (Chronic)   HH to monitor BP on current medications These are via tube with no issues so far per wife      Anoxic encephalopathy (HCC) - Primary   He now home with wife - HH and PDN have not started Per Helmut Muster, he does not have any meaningful interaction - his eye are open at times He remains a full code per wife's wishes      Seizures (HCC)   No seizures seen recently. Continue current regimen without change for now. Would like to get labs in about 2 months      Chronic systolic heart failure (HCC)   On Coreg and lasix      Tracheostomy in place [facility])   Gastrostomy tube in place [facility])   Discussed with [place] - Home health and [facility] to help with supplies, tube feeding, oxygen.  PDN to start in about 10  days.  I expect that [facility] and [facility] will sent orders to me to sign as needed. Will plan to order labs after next video check in.  I spent 25 minutes on this encounter, including reviewing records and labs.  100% by video.  Return in about 2 months (around 08/30/2023).    Reubin Milan, MD [facility] Primary Care and Sports Medicine [place]

## 2023-06-30 NOTE — Assessment & Plan Note (Signed)
 On Coreg and lasix

## 2023-06-30 NOTE — Assessment & Plan Note (Signed)
 HH to monitor BP on current medications These are via tube with no issues so far per wife

## 2023-06-30 NOTE — Assessment & Plan Note (Signed)
 He now home with wife - HH and PDN have not started Per Helmut Muster, he does not have any meaningful interaction - his eye are open at times He remains a full code per wife's wishes

## 2023-06-30 NOTE — Assessment & Plan Note (Signed)
 No seizures seen recently. Continue current regimen without change for now. Would like to get labs in about 2 months

## 2023-07-01 NOTE — Telephone Encounter (Signed)
 FYI  KP  Copied from CRM [id number]. Topic: Clinical - Medical Advice >> Jul 01, 2023  3:13 PM Shon Hale wrote: Reason for CRM:  [facility] services called Helmut Muster & [employer] denied request for home health services. Arline Asp to reach out to Dr. Judithann Graves to help with appeal.   Helmut Muster states she will send appeal information over via [employer].

## 2023-07-02 ENCOUNTER — Encounter: Payer: Self-pay | Admitting: Internal Medicine

## 2023-07-05 NOTE — Telephone Encounter (Signed)
 FYI  KP

## 2023-07-20 ENCOUNTER — Other Ambulatory Visit: Payer: Self-pay | Admitting: Internal Medicine

## 2023-07-20 DIAGNOSIS — Z930 Tracheostomy status: Secondary | ICD-10-CM

## 2023-07-20 MED ORDER — DEXTROMETHORPHAN-GUAIFENESIN 10-100 MG/5ML PO SYRP: 10.0000 mL | ORAL_SOLUTION | Freq: Four times a day (QID) | ORAL | 5 refills | Status: AC

## 2023-07-20 NOTE — Telephone Encounter (Signed)
 Please review and advise.   JM

## 2023-07-20 NOTE — Progress Notes (Unsigned)
 Date:  07/20/2023   Name:  Alexander Davis   DOB:  January 15, 1961   MRN:  [id number]   Chief Complaint: No chief complaint on file.  HPI  Review of Systems   Lab Results  Component Value Date   NA 142 06/22/2023   K 3.7 06/22/2023   CO2 33 (A) 06/22/2023   GLUCOSE 122 (H) 09/12/2021   BUN 19 06/22/2023   CREATININE 0.7 06/22/2023   CALCIUM 8.3 (A) 06/22/2023   EGFR >60 06/22/2023   GFRNONAA >60 09/12/2021   No results found for: "CHOL", "HDL", "LDLCALC", "LDLDIRECT", "TRIG", "CHOLHDL" Lab Results  Component Value Date   TSH 1.481 01/19/2017   No results found for: "HGBA1C" Lab Results  Component Value Date   WBC 6.0 06/22/2023   HGB 11.5 (A) 06/22/2023   HCT 35 (A) 06/22/2023   MCV 95.5 09/12/2021   PLT 118 (A) 06/22/2023   Lab Results  Component Value Date   ALT 18 09/12/2021   AST 24 09/12/2021   ALKPHOS 92 09/12/2021   BILITOT 0.8 09/12/2021   No results found for: "25OHVITD2", "25OHVITD3", "VD25OH"   Patient Active Problem List   Diagnosis Date Noted   Tracheostomy in place [facility]) 06/30/2023   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) 06/30/2023   Chronic systolic heart failure (HCC) 06/28/2023   Failure to thrive in adult 01/19/2023   Anoxic encephalopathy (HCC) 12/27/2022   Seizures (HCC) 12/27/2022   Ventricular tachycardia (HCC) 12/22/2022   Hx of respiratory failure 12/22/2022   Hx of cardiac arrest 12/22/2022   Hx of ST elevation myocardial infarction 12/22/2022   Mild cardiomegaly 02/20/2022   Aneurysm of ascending aorta without rupture (HCC) 02/20/2022   Vertigo 11/11/2019   Family history of prostate cancer in father 01/19/2017   Essential hypertension 02/12/2015    Allergies  Allergen Reactions   Diltiazem Swelling    Lip swelling   Fish Allergy Anaphylaxis   Norvasc [Amlodipine] Swelling   Peanut-Containing Drug Products Anaphylaxis and Swelling    Lips swell   Penicillins Anaphylaxis    Has patient had a PCN reaction causing  immediate rash, facial/tongue/throat swelling, SOB or lightheadedness with hypotension: Unknown Has patient had a PCN reaction causing severe rash involving mucus membranes or skin necrosis: Unknown Has patient had a PCN reaction that required hospitalization: Unknown Has patient had a PCN reaction occurring within the last 10 years: No If all of the above answers are "NO", then may proceed with Cephalosporin use.     Hctz [Hydrochlorothiazide] Other (See Comments)    Muscle cramps    Past Surgical History:  Procedure Laterality Date   HERNIA REPAIR     age 6    Social History   Tobacco Use   Smoking status: Never   Smokeless tobacco: Never  Vaping Use   Vaping status: Never Used  Substance Use Topics   Alcohol use: No    Alcohol/week: 0.0 standard drinks of alcohol   Drug use: No     Medication list has been reviewed and updated.  Current Meds  Medication Sig   Dextromethorphan-guaiFENesin 10-100 MG/5ML liquid Place 10 mLs into feeding tube every 6 (six) hours.       10/14/2022    2:31 PM 07/28/2022    4:04 PM 12/17/2021    1:41 PM 08/29/2021   11:22 AM  GAD 7 : Generalized Anxiety Score  Nervous, Anxious, on Edge 1 0 0 0  Control/stop worrying 1 0 0 0  Worry too  much - different things 1 1 1 1   Trouble relaxing 0 0 0 0  Restless 0 0 0 0  Easily annoyed or irritable 0 0 0 0  Afraid - awful might happen 0 1 1 0  Total GAD 7 Score 3 2 2 1   Anxiety Difficulty Not difficult at all Not difficult at all Not difficult at all Somewhat difficult       10/14/2022    2:31 PM 07/28/2022    4:04 PM 12/17/2021    1:41 PM  Depression screen PHQ 2/9  Decreased Interest 0 1 1  Down, Depressed, Hopeless 0 1 1  PHQ - 2 Score 0 2 2  Altered sleeping 0 1 0  Tired, decreased energy 0 1 1  Change in appetite 0 1 1  Feeling bad or failure about yourself  0 1 1  Trouble concentrating 0 0 0  Moving slowly or fidgety/restless 0 0 0  Suicidal thoughts 0 0 0  PHQ-9 Score 0 6 5   Difficult doing work/chores Not difficult at all Somewhat difficult Not difficult at all    BP Readings from Last 3 Encounters:  10/14/22 128/80  07/28/22 (!) 164/110  02/20/22 129/79    Physical Exam  Wt Readings from Last 3 Encounters:  06/30/23 243 lb (110.2 kg)  10/14/22 243 lb (110.2 kg)  07/28/22 242 lb (109.8 kg)    There were no vitals taken for this visit.  Assessment and Plan:  Problem List Items Addressed This Visit       Unprioritized   Tracheostomy in place [facility]) - Primary   Relevant Medications   Dextromethorphan-guaiFENesin 10-100 MG/5ML liquid    No follow-ups on file.    Reubin Milan, MD [facility] Primary Care and Sports Medicine [place]

## 2023-07-21 NOTE — Telephone Encounter (Signed)
 Pt response.  KP

## 2023-07-21 NOTE — Telephone Encounter (Signed)
 Contacted [employer] [phone] about the above message, they stated that there was no appeal started because they require a member appeal representation authorization form to be completed by member and faxed back to them to allow provider to request an appeal on behave of the patient. Form was printed, Ms. Zywicki would need to sign it.   Fax provided: [phone] Mail address provider: [address] [place], [place] [address]

## 2023-07-26 ENCOUNTER — Encounter: Payer: Self-pay | Admitting: Internal Medicine

## 2023-07-27 ENCOUNTER — Other Ambulatory Visit: Payer: Self-pay | Admitting: Internal Medicine

## 2023-07-27 ENCOUNTER — Telehealth: Payer: Self-pay | Admitting: Internal Medicine

## 2023-07-27 DIAGNOSIS — I517 Cardiomegaly: Secondary | ICD-10-CM

## 2023-07-27 DIAGNOSIS — R569 Unspecified convulsions: Secondary | ICD-10-CM

## 2023-07-27 DIAGNOSIS — I10 Essential (primary) hypertension: Secondary | ICD-10-CM

## 2023-07-27 DIAGNOSIS — Z8709 Personal history of other diseases of the respiratory system: Secondary | ICD-10-CM

## 2023-07-27 DIAGNOSIS — K5901 Slow transit constipation: Secondary | ICD-10-CM

## 2023-07-27 DIAGNOSIS — I472 Ventricular tachycardia, unspecified: Secondary | ICD-10-CM

## 2023-07-27 DIAGNOSIS — G931 Anoxic brain damage, not elsewhere classified: Secondary | ICD-10-CM

## 2023-07-27 DIAGNOSIS — I7121 Aneurysm of the ascending aorta, without rupture: Secondary | ICD-10-CM

## 2023-07-27 DIAGNOSIS — I252 Old myocardial infarction: Secondary | ICD-10-CM

## 2023-07-27 DIAGNOSIS — I5022 Chronic systolic (congestive) heart failure: Secondary | ICD-10-CM

## 2023-07-27 DIAGNOSIS — R002 Palpitations: Secondary | ICD-10-CM

## 2023-07-27 MED ORDER — ACETAMINOPHEN 325 MG PO TABS: 650.0000 mg | ORAL_TABLET | Freq: Four times a day (QID) | ORAL | 3 refills | Status: AC | PRN

## 2023-07-27 MED ORDER — OSMOLITE 1.5 CAL PO LIQD: 65.0000 mL | ORAL | 5 refills | Status: AC

## 2023-07-27 MED ORDER — CARVEDILOL 3.125 MG PO TABS
6.2500 mg | ORAL_TABLET | Freq: Two times a day (BID) | ORAL | 1 refills | Status: DC
Start: 2023-07-27 — End: 2024-01-21

## 2023-07-27 MED ORDER — VALPROIC ACID 250 MG/5ML PO SOLN: 250.0000 mg | Freq: Four times a day (QID) | ORAL | 3 refills | Status: DC

## 2023-07-27 MED ORDER — LEVETIRACETAM 100 MG/ML PO SOLN: 1000.0000 mg | Freq: Two times a day (BID) | ORAL | 3 refills | Status: DC

## 2023-07-27 MED ORDER — FUROSEMIDE 40 MG PO TABS: 40.0000 mg | ORAL_TABLET | Freq: Every day | ORAL | 1 refills | Status: DC

## 2023-07-27 MED ORDER — ATORVASTATIN CALCIUM 80 MG PO TABS: 80.0000 mg | ORAL_TABLET | Freq: Every day | ORAL | 1 refills | Status: DC

## 2023-07-27 MED ORDER — LACTULOSE 10 GM/15ML PO SOLN: 20.0000 g | Freq: Every day | ORAL | 3 refills | Status: DC

## 2023-07-27 MED ORDER — AMLODIPINE BESYLATE 5 MG PO TABS: 5.0000 mg | ORAL_TABLET | Freq: Two times a day (BID) | ORAL | 1 refills | Status: DC

## 2023-07-27 MED ORDER — IPRATROPIUM-ALBUTEROL 0.5-2.5 (3) MG/3ML IN SOLN: 3.0000 mL | Freq: Four times a day (QID) | RESPIRATORY_TRACT | 0 refills | Status: AC | PRN

## 2023-07-27 MED ORDER — PRASUGREL HCL 10 MG PO TABS: 10.0000 mg | ORAL_TABLET | Freq: Every day | ORAL | 1 refills | Status: DC

## 2023-07-27 MED ORDER — SENNOSIDES 8.8 MG/5ML PO SYRP: 5.0000 mL | ORAL_SOLUTION | Freq: Two times a day (BID) | ORAL | 3 refills | Status: AC

## 2023-07-27 NOTE — Telephone Encounter (Signed)
 Please review.  KP

## 2023-07-27 NOTE — Telephone Encounter (Signed)
 Spoke at length with wife Helmut Muster.  She still does not have HH or PDN.  I urged her to call [employer] to find out the status of the appeal. I recommend continuing all medications - would not stop anticonvulsants because there might not be any visible signs of recurrent seizure. His BP and O2 sats have been stable.  She is managing his care with the help of family and friends intermittently. No new concerns with skin, trach or feeding tube.

## 2023-07-27 NOTE — Progress Notes (Signed)
 Date:  07/27/2023   Name:  Alexander Davis   DOB:  [birth year]   MRN:  [id number]   Chief Complaint: No chief complaint on file.  HPI  Review of Systems   Lab Results  Component Value Date   NA 142 06/22/2023   K 3.7 06/22/2023   CO2 33 (A) 06/22/2023   GLUCOSE 122 (H) 09/12/2021   BUN 19 06/22/2023   CREATININE 0.7 06/22/2023   CALCIUM 8.3 (A) 06/22/2023   EGFR >60 06/22/2023   GFRNONAA >60 09/12/2021   No results found for: "CHOL", "HDL", "LDLCALC", "LDLDIRECT", "TRIG", "CHOLHDL" Lab Results  Component Value Date   TSH 1.481 01/19/2017   No results found for: "HGBA1C" Lab Results  Component Value Date   WBC 6.0 06/22/2023   HGB 11.5 (A) 06/22/2023   HCT 35 (A) 06/22/2023   MCV 95.5 09/12/2021   PLT 118 (A) 06/22/2023   Lab Results  Component Value Date   ALT 18 09/12/2021   AST 24 09/12/2021   ALKPHOS 92 09/12/2021   BILITOT 0.8 09/12/2021   No results found for: "25OHVITD2", "25OHVITD3", "VD25OH"   Patient Active Problem List   Diagnosis Date Noted   Tracheostomy in place [facility]) 06/30/2023   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) 06/30/2023   Chronic systolic heart failure (HCC) 06/28/2023   Failure to thrive in adult 01/19/2023   Anoxic encephalopathy (HCC) 12/27/2022   Seizures (HCC) 12/27/2022   Ventricular tachycardia (HCC) 12/22/2022   Hx of respiratory failure 12/22/2022   Hx of cardiac arrest 12/22/2022   Hx of ST elevation myocardial infarction 12/22/2022   Mild cardiomegaly 02/20/2022   Aneurysm of ascending aorta without rupture (HCC) 02/20/2022   Vertigo 11/11/2019   Family history of prostate cancer in father 01/19/2017   Essential hypertension 02/12/2015    Allergies  Allergen Reactions   Diltiazem Swelling    Lip swelling   Fish Allergy Anaphylaxis   Norvasc [Amlodipine] Swelling   Peanut-Containing Drug Products Anaphylaxis and Swelling    Lips swell   Penicillins Anaphylaxis    Has patient had a PCN reaction causing immediate  rash, facial/tongue/throat swelling, SOB or lightheadedness with hypotension: Unknown Has patient had a PCN reaction causing severe rash involving mucus membranes or skin necrosis: Unknown Has patient had a PCN reaction that required hospitalization: Unknown Has patient had a PCN reaction occurring within the last 10 years: No If all of the above answers are "NO", then may proceed with Cephalosporin use.     Hctz [Hydrochlorothiazide] Other (See Comments)    Muscle cramps    Past Surgical History:  Procedure Laterality Date   HERNIA REPAIR     age 26    Social History   Tobacco Use   Smoking status: Never   Smokeless tobacco: Never  Vaping Use   Vaping status: Never Used  Substance Use Topics   Alcohol use: No    Alcohol/week: 0.0 standard drinks of alcohol   Drug use: No     Medication list has been reviewed and updated.  No outpatient medications have been marked as taking for the 07/27/23 encounter (Orders Only) with Reubin Milan, MD.       10/14/2022    2:31 PM 07/28/2022    4:04 PM 12/17/2021    1:41 PM 08/29/2021   11:22 AM  GAD 7 : Generalized Anxiety Score  Nervous, Anxious, on Edge 1 0 0 0  Control/stop worrying 1 0 0 0  Worry too much -  different things 1 1 1 1   Trouble relaxing 0 0 0 0  Restless 0 0 0 0  Easily annoyed or irritable 0 0 0 0  Afraid - awful might happen 0 1 1 0  Total GAD 7 Score 3 2 2 1   Anxiety Difficulty Not difficult at all Not difficult at all Not difficult at all Somewhat difficult       10/14/2022    2:31 PM 07/28/2022    4:04 PM 12/17/2021    1:41 PM  Depression screen PHQ 2/9  Decreased Interest 0 1 1  Down, Depressed, Hopeless 0 1 1  PHQ - 2 Score 0 2 2  Altered sleeping 0 1 0  Tired, decreased energy 0 1 1  Change in appetite 0 1 1  Feeling bad or failure about yourself  0 1 1  Trouble concentrating 0 0 0  Moving slowly or fidgety/restless 0 0 0  Suicidal thoughts 0 0 0  PHQ-9 Score 0 6 5  Difficult doing work/chores  Not difficult at all Somewhat difficult Not difficult at all    BP Readings from Last 3 Encounters:  10/14/22 128/80  07/28/22 (!) 164/110  02/20/22 129/79    Physical Exam  Wt Readings from Last 3 Encounters:  06/30/23 243 lb (110.2 kg)  10/14/22 243 lb (110.2 kg)  07/28/22 242 lb (109.8 kg)    There were no vitals taken for this visit.  Assessment and Plan:  Problem List Items Addressed This Visit   None   No follow-ups on file.    Reubin Milan, MD [facility] Primary Care and Sports Medicine [place]

## 2023-08-02 NOTE — Telephone Encounter (Signed)
 Please review.  KP

## 2023-08-03 NOTE — Telephone Encounter (Signed)
 Pt response.  KP

## 2023-08-03 NOTE — Telephone Encounter (Signed)
 Please review. JM

## 2023-08-06 ENCOUNTER — Other Ambulatory Visit: Payer: Self-pay | Admitting: Internal Medicine

## 2023-08-06 DIAGNOSIS — R569 Unspecified convulsions: Secondary | ICD-10-CM

## 2023-08-06 DIAGNOSIS — Z930 Tracheostomy status: Secondary | ICD-10-CM

## 2023-08-06 DIAGNOSIS — I5022 Chronic systolic (congestive) heart failure: Secondary | ICD-10-CM

## 2023-08-06 MED ORDER — VALPROIC ACID 250 MG/5ML PO SOLN: 400.0000 mg | Freq: Four times a day (QID) | ORAL | 3 refills | Status: DC

## 2023-08-06 NOTE — Telephone Encounter (Signed)
 Please review.  KP

## 2023-08-06 NOTE — Progress Notes (Unsigned)
 Date:  08/06/2023   Name:  Alexander Davis   DOB:  November 18, 1960   MRN:  [id number]   Chief Complaint: No chief complaint on file.  HPI  Review of Systems   Lab Results  Component Value Date   NA 142 06/22/2023   K 3.7 06/22/2023   CO2 33 (A) 06/22/2023   GLUCOSE 122 (H) 09/12/2021   BUN 19 06/22/2023   CREATININE 0.7 06/22/2023   CALCIUM 8.3 (A) 06/22/2023   EGFR >60 06/22/2023   GFRNONAA >60 09/12/2021   No results found for: "CHOL", "HDL", "LDLCALC", "LDLDIRECT", "TRIG", "CHOLHDL" Lab Results  Component Value Date   TSH 1.481 01/19/2017   No results found for: "HGBA1C" Lab Results  Component Value Date   WBC 6.0 06/22/2023   HGB 11.5 (A) 06/22/2023   HCT 35 (A) 06/22/2023   MCV 95.5 09/12/2021   PLT 118 (A) 06/22/2023   Lab Results  Component Value Date   ALT 18 09/12/2021   AST 24 09/12/2021   ALKPHOS 92 09/12/2021   BILITOT 0.8 09/12/2021   No results found for: "25OHVITD2", "25OHVITD3", "VD25OH"   Patient Active Problem List   Diagnosis Date Noted   [facility] in place [facility]) 06/30/2023   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) 06/30/2023   Chronic systolic heart failure (HCC) 06/28/2023   Failure to thrive in adult 01/19/2023   Anoxic encephalopathy (HCC) 12/27/2022   Seizures (HCC) 12/27/2022   Ventricular tachycardia (HCC) 12/22/2022   Hx of respiratory failure 12/22/2022   Hx of cardiac arrest 12/22/2022   Hx of ST elevation myocardial infarction 12/22/2022   Mild cardiomegaly 02/20/2022   Aneurysm of ascending aorta without rupture (HCC) 02/20/2022   Vertigo 11/11/2019   Family history of prostate cancer in father 01/19/2017   Essential hypertension 02/12/2015    Allergies  Allergen Reactions   Diltiazem Swelling    Lip swelling   Fish Allergy Anaphylaxis   Peanut-Containing Drug Products Anaphylaxis and Swelling    Lips swell   Penicillins Anaphylaxis    Has patient had a PCN reaction causing immediate rash, facial/tongue/throat  swelling, SOB or lightheadedness with hypotension: Unknown Has patient had a PCN reaction causing severe rash involving mucus membranes or skin necrosis: Unknown Has patient had a PCN reaction that required hospitalization: Unknown Has patient had a PCN reaction occurring within the last 10 years: No If all of the above answers are "NO", then may proceed with Cephalosporin use.     Hctz [Hydrochlorothiazide] Other (See Comments)    Muscle cramps    Past Surgical History:  Procedure Laterality Date   HERNIA REPAIR     age 65    Social History   Tobacco Use   Smoking status: Never   Smokeless tobacco: Never  Vaping Use   Vaping status: Never Used  Substance Use Topics   Alcohol use: No    Alcohol/week: 0.0 standard drinks of alcohol   Drug use: No     Medication list has been reviewed and updated.  No outpatient medications have been marked as taking for the 08/06/23 encounter (Orders Only) with Reubin Milan, MD.       10/14/2022    2:31 PM 07/28/2022    4:04 PM 12/17/2021    1:41 PM 08/29/2021   11:22 AM  GAD 7 : Generalized Anxiety Score  Nervous, Anxious, on Edge 1 0 0 0  Control/stop worrying 1 0 0 0  Worry too much - different things 1 1 1  1  Trouble relaxing 0 0 0 0  Restless 0 0 0 0  Easily annoyed or irritable 0 0 0 0  Afraid - awful might happen 0 1 1 0  Total GAD 7 Score 3 2 2 1   Anxiety Difficulty Not difficult at all Not difficult at all Not difficult at all Somewhat difficult       10/14/2022    2:31 PM 07/28/2022    4:04 PM 12/17/2021    1:41 PM  Depression screen PHQ 2/9  Decreased Interest 0 1 1  Down, Depressed, Hopeless 0 1 1  PHQ - 2 Score 0 2 2  Altered sleeping 0 1 0  Tired, decreased energy 0 1 1  Change in appetite 0 1 1  Feeling bad or failure about yourself  0 1 1  Trouble concentrating 0 0 0  Moving slowly or fidgety/restless 0 0 0  Suicidal thoughts 0 0 0  PHQ-9 Score 0 6 5  Difficult doing work/chores Not difficult at all  Somewhat difficult Not difficult at all    BP Readings from Last 3 Encounters:  10/14/22 128/80  07/28/22 (!) 164/110  02/20/22 129/79    Physical Exam  Wt Readings from Last 3 Encounters:  06/30/23 243 lb (110.2 kg)  10/14/22 243 lb (110.2 kg)  07/28/22 242 lb (109.8 kg)    There were no vitals taken for this visit.  Assessment and Plan:  Problem List Items Addressed This Visit       Unprioritized   Seizures (HCC)   Relevant Medications   valproic acid (DEPAKENE) 250 MG/5ML solution    No follow-ups on file.    Reubin Milan, MD [facility] Primary Care and Sports Medicine [place]

## 2023-08-06 NOTE — Telephone Encounter (Signed)
 FYI  KP

## 2023-08-11 ENCOUNTER — Other Ambulatory Visit: Payer: Self-pay | Admitting: Internal Medicine

## 2023-08-11 ENCOUNTER — Encounter: Payer: Self-pay | Admitting: Internal Medicine

## 2023-08-11 DIAGNOSIS — G931 Anoxic brain damage, not elsewhere classified: Secondary | ICD-10-CM

## 2023-08-11 DIAGNOSIS — Z930 Tracheostomy status: Secondary | ICD-10-CM

## 2023-08-11 NOTE — Telephone Encounter (Signed)
 Please review.  KP

## 2023-08-11 NOTE — Progress Notes (Unsigned)
 Date:  08/11/2023   Name:  Alexander Davis   DOB:  Sep 05, 1960   MRN:  [id number]   Chief Complaint: No chief complaint on file.  HPI  Review of Systems   Lab Results  Component Value Date   NA 142 06/22/2023   K 3.7 06/22/2023   CO2 33 (A) 06/22/2023   GLUCOSE 122 (H) 09/12/2021   BUN 19 06/22/2023   CREATININE 0.7 06/22/2023   CALCIUM 8.3 (A) 06/22/2023   EGFR >60 06/22/2023   GFRNONAA >60 09/12/2021   No results found for: "CHOL", "HDL", "LDLCALC", "LDLDIRECT", "TRIG", "CHOLHDL" Lab Results  Component Value Date   TSH 1.481 01/19/2017   No results found for: "HGBA1C" Lab Results  Component Value Date   WBC 6.0 06/22/2023   HGB 11.5 (A) 06/22/2023   HCT 35 (A) 06/22/2023   MCV 95.5 09/12/2021   PLT 118 (A) 06/22/2023   Lab Results  Component Value Date   ALT 18 09/12/2021   AST 24 09/12/2021   ALKPHOS 92 09/12/2021   BILITOT 0.8 09/12/2021   No results found for: "25OHVITD2", "25OHVITD3", "VD25OH"   Patient Active Problem List   Diagnosis Date Noted   Tracheostomy in place [facility]) 06/30/2023   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) 06/30/2023   Chronic systolic heart failure (HCC) 06/28/2023   Failure to thrive in adult 01/19/2023   Anoxic encephalopathy (HCC) 12/27/2022   Seizures (HCC) 12/27/2022   Ventricular tachycardia (HCC) 12/22/2022   Hx of respiratory failure 12/22/2022   Hx of cardiac arrest 12/22/2022   Hx of ST elevation myocardial infarction 12/22/2022   Mild cardiomegaly 02/20/2022   Aneurysm of ascending aorta without rupture (HCC) 02/20/2022   Vertigo 11/11/2019   Family history of prostate cancer in father 01/19/2017   Essential hypertension 02/12/2015    Allergies  Allergen Reactions   Diltiazem Swelling    Lip swelling   Fish Allergy Anaphylaxis   Peanut-Containing Drug Products Anaphylaxis and Swelling    Lips swell   Penicillins Anaphylaxis    Has patient had a PCN reaction causing immediate rash, facial/tongue/throat  swelling, SOB or lightheadedness with hypotension: Unknown Has patient had a PCN reaction causing severe rash involving mucus membranes or skin necrosis: Unknown Has patient had a PCN reaction that required hospitalization: Unknown Has patient had a PCN reaction occurring within the last 10 years: No If all of the above answers are "NO", then may proceed with Cephalosporin use.     Hctz [Hydrochlorothiazide] Other (See Comments)    Muscle cramps    Past Surgical History:  Procedure Laterality Date   HERNIA REPAIR     age 51    Social History   Tobacco Use   Smoking status: Never   Smokeless tobacco: Never  Vaping Use   Vaping status: Never Used  Substance Use Topics   Alcohol use: No    Alcohol/week: 0.0 standard drinks of alcohol   Drug use: No     Medication list has been reviewed and updated.  No outpatient medications have been marked as taking for the 08/11/23 encounter (Orders Only) with Reubin Milan, MD.       10/14/2022    2:31 PM 07/28/2022    4:04 PM 12/17/2021    1:41 PM 08/29/2021   11:22 AM  GAD 7 : Generalized Anxiety Score  Nervous, Anxious, on Edge 1 0 0 0  Control/stop worrying 1 0 0 0  Worry too much - different things 1 1 1  1  Trouble relaxing 0 0 0 0  Restless 0 0 0 0  Easily annoyed or irritable 0 0 0 0  Afraid - awful might happen 0 1 1 0  Total GAD 7 Score 3 2 2 1   Anxiety Difficulty Not difficult at all Not difficult at all Not difficult at all Somewhat difficult       10/14/2022    2:31 PM 07/28/2022    4:04 PM 12/17/2021    1:41 PM  Depression screen PHQ 2/9  Decreased Interest 0 1 1  Down, Depressed, Hopeless 0 1 1  PHQ - 2 Score 0 2 2  Altered sleeping 0 1 0  Tired, decreased energy 0 1 1  Change in appetite 0 1 1  Feeling bad or failure about yourself  0 1 1  Trouble concentrating 0 0 0  Moving slowly or fidgety/restless 0 0 0  Suicidal thoughts 0 0 0  PHQ-9 Score 0 6 5  Difficult doing work/chores Not difficult at all  Somewhat difficult Not difficult at all    BP Readings from Last 3 Encounters:  10/14/22 128/80  07/28/22 (!) 164/110  02/20/22 129/79    Physical Exam  Wt Readings from Last 3 Encounters:  06/30/23 243 lb (110.2 kg)  10/14/22 243 lb (110.2 kg)  07/28/22 242 lb (109.8 kg)    There were no vitals taken for this visit.  Assessment and Plan:  Problem List Items Addressed This Visit   None   No follow-ups on file.    Sheron Dixons, MD [facility] [place]

## 2023-08-11 NOTE — Telephone Encounter (Signed)
 FYI  KP

## 2023-08-13 NOTE — Telephone Encounter (Signed)
 FYI  KP

## 2023-08-13 NOTE — Telephone Encounter (Signed)
 Please review

## 2023-08-24 NOTE — Telephone Encounter (Signed)
 Please review and advise.

## 2023-08-25 ENCOUNTER — Other Ambulatory Visit: Payer: Self-pay | Admitting: Internal Medicine

## 2023-08-25 DIAGNOSIS — H1033 Unspecified acute conjunctivitis, bilateral: Secondary | ICD-10-CM

## 2023-08-25 MED ORDER — NEOMYCIN-POLYMYXIN-DEXAMETH 3.5-10000-0.1 OP SUSP: 2.0000 [drp] | Freq: Four times a day (QID) | OPHTHALMIC | 0 refills | Status: AC

## 2023-08-25 NOTE — Progress Notes (Unsigned)
 Date:  08/25/2023   Name:  Alexander Davis   DOB:  Apr 11, 1961   MRN:  [id number]   Chief Complaint: No chief complaint on file.  HPI  Review of Systems   Lab Results  Component Value Date   NA 142 06/22/2023   K 3.7 06/22/2023   CO2 33 (A) 06/22/2023   GLUCOSE 122 (H) 09/12/2021   BUN 19 06/22/2023   CREATININE 0.7 06/22/2023   CALCIUM  8.3 (A) 06/22/2023   EGFR >60 06/22/2023   GFRNONAA >60 09/12/2021   No results found for: "CHOL", "HDL", "LDLCALC", "LDLDIRECT", "TRIG", "CHOLHDL" Lab Results  Component Value Date   TSH 1.481 01/19/2017   No results found for: "HGBA1C" Lab Results  Component Value Date   WBC 6.0 06/22/2023   HGB 11.5 (A) 06/22/2023   HCT 35 (A) 06/22/2023   MCV 95.5 09/12/2021   PLT 118 (A) 06/22/2023   Lab Results  Component Value Date   ALT 18 09/12/2021   AST 24 09/12/2021   ALKPHOS 92 09/12/2021   BILITOT 0.8 09/12/2021   No results found for: "25OHVITD2", "25OHVITD3", "VD25OH"   Patient Active Problem List   Diagnosis Date Noted   Tracheostomy in place [facility]) 06/30/2023   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) 06/30/2023   Chronic systolic heart failure (HCC) 06/28/2023   Failure to thrive in adult 01/19/2023   Anoxic encephalopathy (HCC) 12/27/2022   Seizures (HCC) 12/27/2022   Ventricular tachycardia (HCC) 12/22/2022   Hx of respiratory failure 12/22/2022   Hx of cardiac arrest 12/22/2022   Hx of ST elevation myocardial infarction 12/22/2022   Mild cardiomegaly 02/20/2022   Aneurysm of ascending aorta without rupture (HCC) 02/20/2022   Vertigo 11/11/2019   Family history of prostate cancer in father 01/19/2017   Essential hypertension 02/12/2015    Allergies  Allergen Reactions   Diltiazem  Swelling    Lip swelling   Fish Allergy Anaphylaxis   Peanut-Containing Drug Products Anaphylaxis and Swelling    Lips swell   Penicillins Anaphylaxis    Has patient had a PCN reaction causing immediate rash, facial/tongue/throat  swelling, SOB or lightheadedness with hypotension: Unknown Has patient had a PCN reaction causing severe rash involving mucus membranes or skin necrosis: Unknown Has patient had a PCN reaction that required hospitalization: Unknown Has patient had a PCN reaction occurring within the last 10 years: No If all of the above answers are "NO", then may proceed with Cephalosporin use.     Hctz [Hydrochlorothiazide ] Other (See Comments)    Muscle cramps    Past Surgical History:  Procedure Laterality Date   HERNIA REPAIR     age 63    Social History   Tobacco Use   Smoking status: Never   Smokeless tobacco: Never  Vaping Use   Vaping status: Never Used  Substance Use Topics   Alcohol use: No    Alcohol/week: 0.0 standard drinks of alcohol   Drug use: No     Medication list has been reviewed and updated.  No outpatient medications have been marked as taking for the 08/25/23 encounter (Orders Only) with Sheron Dixons, MD.       10/14/2022    2:31 PM 07/28/2022    4:04 PM 12/17/2021    1:41 PM 08/29/2021   11:22 AM  GAD 7 : Generalized Anxiety Score  Nervous, Anxious, on Edge 1 0 0 0  Control/stop worrying 1 0 0 0  Worry too much - different things 1 1 1  1  Trouble relaxing 0 0 0 0  Restless 0 0 0 0  Easily annoyed or irritable 0 0 0 0  Afraid - awful might happen 0 1 1 0  Total GAD 7 Score 3 2 2 1   Anxiety Difficulty Not difficult at all Not difficult at all Not difficult at all Somewhat difficult       10/14/2022    2:31 PM 07/28/2022    4:04 PM 12/17/2021    1:41 PM  Depression screen PHQ 2/9  Decreased Interest 0 1 1  Down, Depressed, Hopeless 0 1 1  PHQ - 2 Score 0 2 2  Altered sleeping 0 1 0  Tired, decreased energy 0 1 1  Change in appetite 0 1 1  Feeling bad or failure about yourself  0 1 1  Trouble concentrating 0 0 0  Moving slowly or fidgety/restless 0 0 0  Suicidal thoughts 0 0 0  PHQ-9 Score 0 6 5  Difficult doing work/chores Not difficult at all  Somewhat difficult Not difficult at all    BP Readings from Last 3 Encounters:  10/14/22 128/80  07/28/22 (!) 164/110  02/20/22 129/79    Physical Exam  Wt Readings from Last 3 Encounters:  06/30/23 243 lb (110.2 kg)  10/14/22 243 lb (110.2 kg)  07/28/22 242 lb (109.8 kg)    There were no vitals taken for this visit.  Assessment and Plan:  Problem List Items Addressed This Visit   None   No follow-ups on file.    Sheron Dixons, MD [facility] [place]

## 2023-08-25 NOTE — Telephone Encounter (Signed)
 Pt responded .

## 2023-08-25 NOTE — Telephone Encounter (Signed)
Please review photo .

## 2023-09-06 NOTE — Telephone Encounter (Signed)
 Please review

## 2023-09-07 ENCOUNTER — Other Ambulatory Visit: Payer: Self-pay | Admitting: Internal Medicine

## 2023-09-07 DIAGNOSIS — K5901 Slow transit constipation: Secondary | ICD-10-CM

## 2023-09-07 MED ORDER — LACTULOSE 10 GM/15ML PO SOLN: 20.0000 g | Freq: Every day | ORAL | 2 refills | Status: DC

## 2023-09-07 NOTE — Telephone Encounter (Signed)
 Please review

## 2023-09-07 NOTE — Telephone Encounter (Signed)
 Pt responded .

## 2023-09-10 ENCOUNTER — Other Ambulatory Visit: Payer: Self-pay | Admitting: Internal Medicine

## 2023-09-10 MED ORDER — MUPIROCIN CALCIUM 2 % EX CREA: 1.0000 | TOPICAL_CREAM | Freq: Two times a day (BID) | CUTANEOUS | 2 refills | Status: DC

## 2023-09-13 NOTE — Telephone Encounter (Signed)
 Requested medications are due for refill today.  See note  Requested medications are on the active medications list.  yes  Last refill. 10/11/2023  Future visit scheduled.   no  Notes to clinic.  Pharmacy comment: Please clarify the directions  for this prescription. Please include the dose in grams per application per insurance.     Requested Prescriptions  Pending Prescriptions Disp Refills   mupirocin  cream (BACTROBAN ) 2 % [Pharmacy Med Name: MUPIROCIN  2% CRM] 30 g 2    Sig: APPLY TO SKIN AROUND TRACH AND G-TUBE TWICE DAILY     Off-Protocol Failed - 09/13/2023  4:29 PM      Failed - Medication not assigned to a protocol, review manually.      Failed - Valid encounter within last 12 months    Recent Outpatient Visits           2 months ago Anoxic encephalopathy [facility])   [place] [facility] at [facility], Chales [place], MD

## 2023-09-14 ENCOUNTER — Other Ambulatory Visit: Payer: Self-pay | Admitting: Internal Medicine

## 2023-09-14 ENCOUNTER — Telehealth: Payer: Self-pay | Admitting: Internal Medicine

## 2023-09-14 MED ORDER — MUPIROCIN 2 % EX OINT: 1.0000 | TOPICAL_OINTMENT | Freq: Two times a day (BID) | CUTANEOUS | 2 refills | Status: AC

## 2023-09-14 NOTE — Telephone Encounter (Signed)
 Sent [employer].  KP

## 2023-09-14 NOTE — Telephone Encounter (Signed)
 Please review

## 2023-09-14 NOTE — Telephone Encounter (Signed)
 Copied from CRM [id number]. Topic: Clinical - Prescription Issue >> Sep 13, 2023  4:54 PM Turkey B wrote: Reason for CRM: pt's spouse called in, states the (BACTROBAN ) 2 % [Pharmacy Med Name: isnt covered under insurance, so Dr Gala Jubilee was supposed to send the gel instead. Please cb with the status of this

## 2023-09-14 NOTE — Telephone Encounter (Signed)
 Noted  KP

## 2023-09-29 ENCOUNTER — Telehealth: Payer: Self-pay

## 2023-09-29 ENCOUNTER — Encounter: Payer: Self-pay | Admitting: Internal Medicine

## 2023-09-29 NOTE — Telephone Encounter (Signed)
 Pt responded .

## 2023-09-29 NOTE — Telephone Encounter (Signed)
 Please review message

## 2023-09-29 NOTE — Telephone Encounter (Signed)
 Copied from CRM [id number]. Topic: General - Other >> Sep 28, 2023  3:29 PM Star [place] wrote: Reason for CRM: Nellie with [facility]- they need a letter of necessity for patient to receive home health services to get authorization approved- they need specific details- fax [phone]  phone [phone]

## 2023-09-29 NOTE — Telephone Encounter (Signed)
 PT response.  JM

## 2023-09-30 NOTE — Telephone Encounter (Signed)
Please review response

## 2023-09-30 NOTE — Telephone Encounter (Signed)
 Letter has been faxed.   Fax #: [phone] Attention: Jeanette Milks [facility]

## 2023-10-04 ENCOUNTER — Other Ambulatory Visit: Payer: Self-pay

## 2023-10-04 MED ORDER — CLINDAMYCIN PALMITATE HCL 75 MG/5ML PO SOLR
300.0000 mg | Freq: Three times a day (TID) | ORAL | 0 refills | Status: AC
Start: 2023-10-04 — End: 2023-10-14

## 2023-10-18 NOTE — Telephone Encounter (Signed)
 Please review

## 2023-10-26 ENCOUNTER — Other Ambulatory Visit: Payer: Self-pay | Admitting: Internal Medicine

## 2023-10-26 DIAGNOSIS — R569 Unspecified convulsions: Secondary | ICD-10-CM

## 2023-10-27 NOTE — Telephone Encounter (Signed)
 Requested medication (s) are due for refill today:   Yes  Requested medication (s) are on the active medication list:   Yes  Future visit scheduled:   No.   Video visit was on 06/30/2023   Last ordered: 07/27/2023 473 ml, 3 refills  Unable to refill because PHQ-9 or 2 due per protocol.      Requested Prescriptions  Pending Prescriptions Disp Refills   levETIRAcetam  (KEPPRA ) 100 MG/ML solution [Pharmacy Med Name: levETIRAcetam  100 MG/ML Oral Solution] 473 mL 0    Sig: PLACE 10 ML INTO FEEDING TUBE EVERY 12 HOURS     Neurology:  Anticonvulsants - levetiracetam  Failed - 10/27/2023  3:27 PM      Failed - Completed PHQ-2 or PHQ-9 in the last 360 days      Passed - Cr in normal range and within 360 days    Creatinine  Date Value Ref Range Status  06/22/2023 0.7 0.6 - 1.3 Final   Creatinine, Ser  Date Value Ref Range Status  09/12/2021 0.98 0.61 - 1.24 mg/dL Final         Passed - Valid encounter within last 12 months    Recent Outpatient Visits           3 months ago Anoxic encephalopathy [facility])   [place] [facility] at [facility], Leita DEL, MD

## 2023-10-28 ENCOUNTER — Other Ambulatory Visit: Payer: Self-pay | Admitting: Internal Medicine

## 2023-10-28 DIAGNOSIS — R569 Unspecified convulsions: Secondary | ICD-10-CM

## 2023-11-01 NOTE — Telephone Encounter (Signed)
 Refused Keppra  because this is a duplicate request.

## 2023-11-16 NOTE — Telephone Encounter (Signed)
Response.  KP

## 2023-11-26 ENCOUNTER — Other Ambulatory Visit: Payer: Self-pay | Admitting: Internal Medicine

## 2023-11-26 ENCOUNTER — Other Ambulatory Visit: Payer: Self-pay

## 2023-11-26 ENCOUNTER — Telehealth: Payer: Self-pay

## 2023-11-26 DIAGNOSIS — R569 Unspecified convulsions: Secondary | ICD-10-CM

## 2023-11-26 NOTE — Telephone Encounter (Signed)
 Copied from CRM [id number]. Topic: Clinical - Prescription Issue >> Nov 26, 2023  1:46 PM Alexander Davis wrote: Reason for CRM:Pt's wife stated pt is out of levETIRAcetam  (KEPPRA ) 100 MG/ML solution and pharmacy has requested refill.

## 2023-11-26 NOTE — Telephone Encounter (Signed)
Refill sent in.  KP 

## 2023-11-29 NOTE — Telephone Encounter (Signed)
 Requested Prescriptions  Refused Prescriptions Disp Refills   levETIRAcetam  (KEPPRA ) 100 MG/ML solution [Pharmacy Med Name: levETIRAcetam  100 MG/ML Oral Solution] 473 mL 0    Sig: PLACE INTO FEEDING TUBE EVERY 12 HOURS     Neurology:  Anticonvulsants - levetiracetam  Failed - 11/29/2023  1:38 PM      Failed - Completed PHQ-2 or PHQ-9 in the last 360 days      Passed - Cr in normal range and within 360 days    Creatinine  Date Value Ref Range Status  06/22/2023 0.7 0.6 - 1.3 Final   Creatinine, Ser  Date Value Ref Range Status  09/12/2021 0.98 0.61 - 1.24 mg/dL Final         Passed - Valid encounter within last 12 months    Recent Outpatient Visits           5 months ago Anoxic encephalopathy [facility])   [place] [facility] at [facility], Leita DEL, MD

## 2023-12-06 ENCOUNTER — Telehealth: Payer: Self-pay

## 2023-12-06 NOTE — Telephone Encounter (Signed)
 Copied from CRM #[id number]. Topic: General - Other >> Dec 06, 2023 10:12 AM Chiquita SQUIBB wrote: Reason for CRM: Patients wife is calling in stating the patients nephew will be dropping off to the office, if the doctor can fill it out and fax it over.

## 2023-12-08 ENCOUNTER — Encounter: Payer: Self-pay | Admitting: Internal Medicine

## 2023-12-10 ENCOUNTER — Other Ambulatory Visit: Payer: Self-pay | Admitting: Internal Medicine

## 2023-12-10 DIAGNOSIS — Z930 Tracheostomy status: Secondary | ICD-10-CM

## 2023-12-10 DIAGNOSIS — I5022 Chronic systolic (congestive) heart failure: Secondary | ICD-10-CM

## 2023-12-10 DIAGNOSIS — G931 Anoxic brain damage, not elsewhere classified: Secondary | ICD-10-CM

## 2023-12-10 DIAGNOSIS — Z931 Gastrostomy status: Secondary | ICD-10-CM

## 2023-12-10 DIAGNOSIS — R627 Adult failure to thrive: Secondary | ICD-10-CM

## 2023-12-10 NOTE — Progress Notes (Unsigned)
 Date:  12/10/2023   Name:  Alexander Davis   DOB:  1960/11/10   MRN:  [id number]   Chief Complaint: No chief complaint on file.  HPI  Review of Systems   Lab Results  Component Value Date   NA 142 06/22/2023   K 3.7 06/22/2023   CO2 33 (A) 06/22/2023   GLUCOSE 122 (H) 09/12/2021   BUN 19 06/22/2023   CREATININE 0.7 06/22/2023   CALCIUM  8.3 (A) 06/22/2023   EGFR >60 06/22/2023   GFRNONAA >60 09/12/2021   No results found for: CHOL, HDL, LDLCALC, LDLDIRECT, TRIG, CHOLHDL Lab Results  Component Value Date   TSH 1.481 01/19/2017   No results found for: HGBA1C Lab Results  Component Value Date   WBC 6.0 06/22/2023   HGB 11.5 (A) 06/22/2023   HCT 35 (A) 06/22/2023   MCV 95.5 09/12/2021   PLT 118 (A) 06/22/2023   Lab Results  Component Value Date   ALT 18 09/12/2021   AST 24 09/12/2021   ALKPHOS 92 09/12/2021   BILITOT 0.8 09/12/2021   No results found for: MARIEN BOLLS, VD25OH   Patient Active Problem List   Diagnosis Date Noted   Tracheostomy in place [facility]) 06/30/2023   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) 06/30/2023   Chronic systolic heart failure (HCC) 06/28/2023   Failure to thrive in adult 01/19/2023   Anoxic encephalopathy (HCC) 12/27/2022   Seizures (HCC) 12/27/2022   Ventricular tachycardia (HCC) 12/22/2022   Hx of respiratory failure 12/22/2022   Hx of cardiac arrest 12/22/2022   Hx of ST elevation myocardial infarction 12/22/2022   Mild cardiomegaly 02/20/2022   Aneurysm of ascending aorta without rupture (HCC) 02/20/2022   Vertigo 11/11/2019   Family history of prostate cancer in father 01/19/2017   Essential hypertension 02/12/2015    Allergies  Allergen Reactions   Diltiazem  Swelling    Lip swelling   Fish Allergy Anaphylaxis   Peanut-Containing Drug Products Anaphylaxis and Swelling    Lips swell   Penicillins Anaphylaxis    Has patient had a PCN reaction causing immediate rash, facial/tongue/throat  swelling, SOB or lightheadedness with hypotension: Unknown Has patient had a PCN reaction causing severe rash involving mucus membranes or skin necrosis: Unknown Has patient had a PCN reaction that required hospitalization: Unknown Has patient had a PCN reaction occurring within the last 10 years: No If all of the above answers are NO, then may proceed with Cephalosporin use.     Hctz [Hydrochlorothiazide ] Other (See Comments)    Muscle cramps    Past Surgical History:  Procedure Laterality Date   HERNIA REPAIR     age 5    Social History   Tobacco Use   Smoking status: Never   Smokeless tobacco: Never  Vaping Use   Vaping status: Never Used  Substance Use Topics   Alcohol use: No    Alcohol/week: 0.0 standard drinks of alcohol   Drug use: No     Medication list has been reviewed and updated.  No outpatient medications have been marked as taking for the 12/10/23 encounter (Orders Only) with Justus Leita DEL, MD.       10/14/2022    2:31 PM 07/28/2022    4:04 PM 12/17/2021    1:41 PM 08/29/2021   11:22 AM  GAD 7 : Generalized Anxiety Score  Nervous, Anxious, on Edge 1 0 0 0  Control/stop worrying 1 0 0 0  Worry too much - different things 1 1 1  1  Trouble relaxing 0 0 0 0  Restless 0 0 0 0  Easily annoyed or irritable 0 0 0 0  Afraid - awful might happen 0 1 1 0  Total GAD 7 Score 3 2 2 1   Anxiety Difficulty Not difficult at all Not difficult at all Not difficult at all Somewhat difficult       10/14/2022    2:31 PM 07/28/2022    4:04 PM 12/17/2021    1:41 PM  Depression screen PHQ 2/9  Decreased Interest 0 1 1  Down, Depressed, Hopeless 0 1 1  PHQ - 2 Score 0 2 2  Altered sleeping 0 1 0  Tired, decreased energy 0 1 1  Change in appetite 0 1 1  Feeling bad or failure about yourself  0 1 1  Trouble concentrating 0 0 0  Moving slowly or fidgety/restless 0 0 0  Suicidal thoughts 0 0 0  PHQ-9 Score 0 6 5  Difficult doing work/chores Not difficult at all  Somewhat difficult Not difficult at all    BP Readings from Last 3 Encounters:  10/14/22 128/80  07/28/22 (!) 164/110  02/20/22 129/79    Physical Exam  Wt Readings from Last 3 Encounters:  06/30/23 243 lb (110.2 kg)  10/14/22 243 lb (110.2 kg)  07/28/22 242 lb (109.8 kg)    There were no vitals taken for this visit.  Assessment and Plan:  Problem List Items Addressed This Visit   None   No follow-ups on file.    Leita HILARIO Adie, MD [facility] [place]

## 2023-12-20 ENCOUNTER — Other Ambulatory Visit: Payer: Self-pay | Admitting: Internal Medicine

## 2023-12-20 DIAGNOSIS — R569 Unspecified convulsions: Secondary | ICD-10-CM

## 2023-12-21 ENCOUNTER — Other Ambulatory Visit: Payer: Self-pay

## 2023-12-22 NOTE — Telephone Encounter (Signed)
 Please review and advise patient.   JM

## 2023-12-23 ENCOUNTER — Emergency Department (HOSPITAL_COMMUNITY)
Admission: EM | Admit: 2023-12-23 | Discharge: 2023-12-23 | Disposition: A | Discharge status: Home / Self Care | Attending: Emergency Medicine | Admitting: Emergency Medicine

## 2023-12-23 ENCOUNTER — Emergency Department (HOSPITAL_COMMUNITY)

## 2023-12-23 ENCOUNTER — Encounter (HOSPITAL_COMMUNITY): Payer: Self-pay

## 2023-12-23 ENCOUNTER — Other Ambulatory Visit: Payer: Self-pay

## 2023-12-23 DIAGNOSIS — R1110 Vomiting, unspecified: Secondary | ICD-10-CM | POA: Insufficient documentation

## 2023-12-23 DIAGNOSIS — Z91010 Allergy to peanuts: Secondary | ICD-10-CM | POA: Diagnosis not present

## 2023-12-23 DIAGNOSIS — R059 Cough, unspecified: Secondary | ICD-10-CM | POA: Diagnosis not present

## 2023-12-23 LAB — RESP PANEL BY RT-PCR (RSV, FLU A&B, COVID)  RVPGX2
Influenza A by PCR: NEGATIVE
Influenza B by PCR: NEGATIVE
Resp Syncytial Virus by PCR: NEGATIVE
SARS Coronavirus 2 by RT PCR: NEGATIVE

## 2023-12-23 LAB — CBC
HCT: 37.8 % — ABNORMAL LOW (ref 39.0–52.0)
Hemoglobin: 12.4 g/dL — ABNORMAL LOW (ref 13.0–17.0)
MCH: 32.9 pg (ref 26.0–34.0)
MCHC: 32.8 g/dL (ref 30.0–36.0)
MCV: 100.3 fL — ABNORMAL HIGH (ref 80.0–100.0)
Platelets: 117 K/uL — ABNORMAL LOW (ref 150–400)
RBC: 3.77 MIL/uL — ABNORMAL LOW (ref 4.22–5.81)
RDW: 13.4 % (ref 11.5–15.5)
WBC: 6.1 K/uL (ref 4.0–10.5)
nRBC: 0 % (ref 0.0–0.2)

## 2023-12-23 LAB — I-STAT CG4 LACTIC ACID, ED
Lactic Acid, Venous: 2.3 mmol/L (ref 0.5–1.9)
Lactic Acid, Venous: 1.5 mmol/L (ref 0.5–1.9)

## 2023-12-23 LAB — COMPREHENSIVE METABOLIC PANEL WITH GFR
ALT: 23 U/L (ref 0–44)
AST: 28 U/L (ref 15–41)
Albumin: 2.5 g/dL — ABNORMAL LOW (ref 3.5–5.0)
Alkaline Phosphatase: 104 U/L (ref 38–126)
Anion gap: 11 (ref 5–15)
BUN: 16 mg/dL (ref 8–23)
CO2: 26 mmol/L (ref 22–32)
Calcium: 8.8 mg/dL — ABNORMAL LOW (ref 8.9–10.3)
Chloride: 104 mmol/L (ref 98–111)
Creatinine, Ser: 0.72 mg/dL (ref 0.61–1.24)
GFR, Estimated: >60 mL/min (ref >60)
Glucose, Bld: 187 mg/dL — ABNORMAL HIGH (ref 70–99)
Potassium: 3.6 mmol/L (ref 3.5–5.1)
Sodium: 141 mmol/L (ref 135–145)
Total Bilirubin: 0.7 mg/dL (ref 0.0–1.2)
Total Protein: 6.8 g/dL (ref 6.5–8.1)

## 2023-12-23 LAB — I-STAT CHEM 8, ED
BUN: 16 mg/dL (ref 8–23)
Calcium, Ion: 1.14 mmol/L — ABNORMAL LOW (ref 1.15–1.40)
Chloride: 102 mmol/L (ref 98–111)
Creatinine, Ser: 0.7 mg/dL (ref 0.61–1.24)
Glucose, Bld: 193 mg/dL — ABNORMAL HIGH (ref 70–99)
HCT: 38 % — ABNORMAL LOW (ref 39.0–52.0)
Hemoglobin: 12.9 g/dL — ABNORMAL LOW (ref 13.0–17.0)
Potassium: 3.5 mmol/L (ref 3.5–5.1)
Sodium: 143 mmol/L (ref 135–145)
TCO2: 29 mmol/L (ref 22–32)

## 2023-12-23 MED ORDER — SODIUM CHLORIDE 0.9 % IV BOLUS
500.0000 mL | Freq: Once | INTRAVENOUS | Status: AC
  Administered 2023-12-23: 500 mL via INTRAVENOUS

## 2023-12-23 NOTE — Telephone Encounter (Signed)
 Please review.  KP

## 2023-12-23 NOTE — Discharge Instructions (Signed)
 As we discussed, decrease the bolus feeds back down to 5 from 10. This should reduce any regurgitation like what is believed to have happened earlier today.   Return to the ED with any new concerns at any time.

## 2023-12-23 NOTE — ED Notes (Addendum)
 Patient brief change

## 2023-12-23 NOTE — ED Notes (Signed)
 Trach suctioned for increased secretions, tolerated well.

## 2023-12-23 NOTE — ED Notes (Signed)
 Tylene Paris PA made aware of Lactic Axid results.

## 2023-12-23 NOTE — ED Notes (Signed)
 [facility] contacted

## 2023-12-23 NOTE — ED Provider Notes (Addendum)
 [place] EMERGENCY DEPARTMENT AT [facility] Provider Note   CSN: [id number] Arrival date & time: 12/23/23  1024     Patient presents with: Shortness of Breath   Alexander Davis is a 63 y.o. male.   Patient to ED with wife, who is caregiver. History anoxic brain injury whose baseline is tracheostomy, PEG tube feeds, minimal responses (opens eyes, withdraws from pain), nonverbal. This morning he had 2 episodes of fitful coughing where a yellow substance was produced through the tracheostomy and his mouth. No fever at home. He is at his baseline physical condition.   The history is provided by the patient. No language interpreter was used.  Shortness of Breath      Prior to Admission medications   Medication Sig Start Date End Date Taking? Authorizing Provider  acetaminophen  (TYLENOL ) 325 MG tablet Place 2 tablets (650 mg total) into feeding tube every 6 (six) hours as needed. 07/27/23   Justus Leita DEL, MD  amLODipine  (NORVASC ) 5 MG tablet Place 1 tablet (5 mg total) into feeding tube every 12 (twelve) hours. 07/27/23   Justus Leita DEL, MD  atorvastatin  (LIPITOR) 80 MG tablet Place 1 tablet (80 mg total) into feeding tube daily. 07/27/23 07/26/24  Justus Leita DEL, MD  carvedilol  (COREG ) 3.125 MG tablet Place 2 tablets (6.25 mg total) into feeding tube in the morning and at bedtime. 07/27/23 07/26/24  Justus Leita DEL, MD  Dextromethorphan -guaiFENesin  10-100 MG/5ML liquid Place 10 mLs into feeding tube every 6 (six) hours. 07/20/23   Justus Leita DEL, MD  furosemide  (LASIX ) 40 MG tablet Place 1 tablet (40 mg total) into feeding tube daily. 07/27/23   Justus Leita DEL, MD  ipratropium-albuterol  (DUONEB) 0.5-2.5 (3) MG/3ML SOLN Take 3 mLs by nebulization every 6 (six) hours as needed. 07/27/23   Justus Leita DEL, MD  lactulose  (CHRONULAC ) 10 GM/15ML solution Place 30 mLs (20 g total) into feeding tube daily. 09/07/23   Justus Leita DEL, MD  levETIRAcetam  (KEPPRA ) 100 MG/ML solution PLACE  10 ML INTO THE FEEDING TUBE  EVERY 12 HOURS 12/21/23   Berglund, Laura H, MD  mupirocin  ointment (BACTROBAN ) 2 % Apply 1 Application topically 2 (two) times daily. 09/14/23   Justus Leita DEL, MD  Nutritional Supplements (FEEDING SUPPLEMENT, OSMOLITE 1.5 CAL,) LIQD Place 65 mLs into feeding tube continuous. 07/27/23   Justus Leita DEL, MD  OVER THE COUNTER MEDICATION Place 20 mg into feeding tube every 6 (six) hours. Maxtussin DM  10 mg -100 mg/5 ml oral solution    [provider]  OXYGEN Inhale into the lungs.    [provider]  prasugrel  (EFFIENT ) 10 MG TABS tablet Place 1 tablet (10 mg total) into feeding tube daily. 07/27/23 07/26/24  Justus Leita DEL, MD  sennosides (SENOKOT) 8.8 MG/5ML syrup Place 5 mLs into feeding tube 2 (two) times daily. 07/27/23   Justus Leita DEL, MD  valproic  acid (DEPAKENE ) 250 MG/5ML solution Place 8 mLs (400 mg total) into feeding tube every 6 (six) hours. 08/06/23   Justus Leita DEL, MD    Allergies: Diltiazem , Fish allergy, Peanut-containing drug products, Penicillins, and Hctz [hydrochlorothiazide ]    Review of Systems  Respiratory:  Positive for shortness of breath.     Updated Vital Signs BP 121/77   Pulse 87   Temp 97.9 F (36.6 C) (Axillary)   Resp 18   SpO2 99%   Physical Exam Vitals and nursing note reviewed.  Constitutional:      Appearance: He is well-developed.  He is not diaphoretic.     Comments: Still, eyes closed, tracheostomy in place without secretions.  HENT:     Head: Atraumatic.  Cardiovascular:     Rate and Rhythm: Normal rate and regular rhythm.  Abdominal:     General: There is no distension.     Palpations: Abdomen is soft.     Comments: PEG in place with clear surroundings.   Musculoskeletal:     Comments: No extremity swelling or pitting edema. No redness, skin breakdown.   Neurological:     Comments: Withdraws from pain (IV start).      (all labs ordered are listed, but only abnormal results are  displayed) Labs Reviewed  CBC - Abnormal; Notable for the following components:      Result Value   RBC 3.77 (*)    Hemoglobin 12.4 (*)    HCT 37.8 (*)    MCV 100.3 (*)    Platelets 117 (*)    All other components within normal limits  COMPREHENSIVE METABOLIC PANEL WITH GFR - Abnormal; Notable for the following components:   Glucose, Bld 187 (*)    Calcium  8.8 (*)    Albumin 2.5 (*)    All other components within normal limits  I-STAT CHEM 8, ED - Abnormal; Notable for the following components:   Glucose, Bld 193 (*)    Calcium , Ion 1.14 (*)    Hemoglobin 12.9 (*)    HCT 38.0 (*)    All other components within normal limits  I-STAT CG4 LACTIC ACID, ED - Abnormal; Notable for the following components:   Lactic Acid, Venous 2.3 (*)    All other components within normal limits  RESP PANEL BY RT-PCR (RSV, FLU A&B, COVID)  [id number]  I-STAT CG4 LACTIC ACID, ED   Results for orders placed or performed during the hospital encounter of 12/23/23  CBC   Collection Time: 12/23/23 11:08 AM  Result Value Ref Range   WBC 6.1 4.0 - 10.5 K/uL   RBC 3.77 (L) 4.22 - 5.81 MIL/uL   Hemoglobin 12.4 (L) 13.0 - 17.0 g/dL   HCT 62.1 (L) 60.9 - 47.9 %   MCV 100.3 (H) 80.0 - 100.0 fL   MCH 32.9 26.0 - 34.0 pg   MCHC 32.8 30.0 - 36.0 g/dL   RDW 86.5 88.4 - 84.4 %   Platelets 117 (L) 150 - 400 K/uL   nRBC 0.0 0.0 - 0.2 %  Comprehensive metabolic panel   Collection Time: 12/23/23 11:14 AM  Result Value Ref Range   Sodium 141 135 - 145 mmol/L   Potassium 3.6 3.5 - 5.1 mmol/L   Chloride 104 98 - 111 mmol/L   CO2 26 22 - 32 mmol/L   Glucose, Bld 187 (H) 70 - 99 mg/dL   BUN 16 8 - 23 mg/dL   Creatinine, Ser 9.27 0.61 - 1.24 mg/dL   Calcium  8.8 (L) 8.9 - 10.3 mg/dL   Total Protein 6.8 6.5 - 8.1 g/dL   Albumin 2.5 (L) 3.5 - 5.0 g/dL   AST 28 15 - 41 U/L   ALT 23 0 - 44 U/L   Alkaline Phosphatase 104 38 - 126 U/L   Total Bilirubin 0.7 0.0 - 1.2 mg/dL   GFR, Estimated >39 >39 mL/min   Anion gap 11  5 - 15  I-stat chem 8, ED (not at [facility], [facility] or [facility])   Collection Time: 12/23/23 11:25 AM  Result Value Ref Range   Sodium 143 135 - 145  mmol/L   Potassium 3.5 3.5 - 5.1 mmol/L   Chloride 102 98 - 111 mmol/L   BUN 16 8 - 23 mg/dL   Creatinine, Ser 9.29 0.61 - 1.24 mg/dL   Glucose, Bld 806 (H) 70 - 99 mg/dL   Calcium , Ion 1.14 (L) 1.15 - 1.40 mmol/L   TCO2 29 22 - 32 mmol/L   Hemoglobin 12.9 (L) 13.0 - 17.0 g/dL   HCT 61.9 (L) 60.9 - 47.9 %  I-Stat Lactic Acid   Collection Time: 12/23/23 11:29 AM  Result Value Ref Range   Lactic Acid, Venous 2.3 (HH) 0.5 - 1.9 mmol/L   Comment NOTIFIED PHYSICIAN   Resp panel by RT-PCR (RSV, Flu A&B, Covid) Anterior Nasal Swab   Collection Time: 12/23/23  1:15 PM   Specimen: Anterior Nasal Swab  Result Value Ref Range   SARS Coronavirus 2 by RT PCR NEGATIVE NEGATIVE   Influenza A by PCR NEGATIVE NEGATIVE   Influenza B by PCR NEGATIVE NEGATIVE   Resp Syncytial Virus by PCR NEGATIVE NEGATIVE  I-Stat Lactic Acid   Collection Time: 12/23/23  2:07 PM  Result Value Ref Range   Lactic Acid, Venous 1.5 0.5 - 1.9 mmol/L     EKG: EKG Interpretation Date/Time:  Thursday December 23 2023 11:07:51 EDT Ventricular Rate:  80 PR Interval:  154 QRS Duration:  99 QT Interval:  391 QTC Calculation: 451 R Axis:   24  Text Interpretation: Sinus rhythm Abnormal T, consider ischemia, lateral leads Confirmed by Yolande Charleston [phone]) on 12/23/2023 12:49:45 PM  Radiology: ARCOLA Abdomen 1 View Result Date: 12/23/2023 CLINICAL DATA:  Vomiting. EXAM: ABDOMEN - 1 VIEW COMPARISON:  None Available. FINDINGS: No abnormal bowel dilatation. Gastrostomy tube is seen in grossly good position. IMPRESSION: No abnormal bowel dilatation. Electronically Signed   By: Lynwood Landy Raddle M.D.   On: 12/23/2023 12:49   DG Chest Portable 1 View Result Date: 12/23/2023 CLINICAL DATA:  Shortness of breath. EXAM: PORTABLE CHEST 1 VIEW COMPARISON:  Chest radiograph dated 12/17/2021. FINDINGS:  Tracheostomy tube tip overlies the level of the midthoracic trachea. Low lung volumes with associated accentuation of the cardiac silhouette and bronchovascular crowding. Overlapping telemetry wires. No focal consolidation, sizeable pleural effusion, or pneumothorax. No acute osseous abnormality. IMPRESSION: Low lung volumes.  No acute cardiopulmonary findings. Electronically Signed   By: Harrietta Sherry M.D.   On: 12/23/2023 11:29     Procedures   Medications Ordered in the ED  sodium chloride  0.9 % bolus 500 mL (500 mLs Intravenous New Bag/Given 12/23/23 1247)    Clinical Course as of 12/23/23 1605  Thu Dec 23, 2023  1212 Patient BIB wife via EMS for episodes of coughing x 2 earlier. No fever. He is at his baseline physical condition. H/O anoxic brain injury. Has trach, PEG, minimal physical ability. Care for by wife at home.  [SU]  1443 Lactic acid was initially elevated but normalized after 500 cc NS.No fever, no white count. Discussed that what wife showed me as productive cough appeared like his PEG feed material - bright yellow. No evidence aspiration on xray and he does not continue coughing in ED. Wife states she recently increased his feed boluses. Feel this is the likely source of cough, and yellow discharge (regurgitation) of feed material. Discussed reducing to previous bolus amount.   Wife requested social work to consult for in home help. She has aids but that cannot assist with medical issues. She states his trach has not been changed since  his discharge from the hospital and will need periodic exchanges. Social work involved.  [SU]  1549 Per social work, home health denies by insurance.   [SU]  1600 Talked to wife. She has an application for Medicaid started. I relayed to her what social work found out and that was that home health was denied by insurance. Patient is stable for discharge home. Transport to be called.  [SU]    Clinical Course User Index [SU] Odell Balls, PA-C                                  Medical Decision Making Amount and/or Complexity of Data Reviewed Labs: ordered. Radiology: ordered.        Final diagnoses:  Regurgitation of stomach contents    ED Discharge Orders     None          Odell Balls, PA-C 12/23/23 1447    Odell Balls, PA-C 12/23/23 1605    Yolande Lamar BROCKS, MD 12/28/23 [phone]
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
## 2023-12-23 NOTE — Care Management (Addendum)
 Transition of Care [facility]) - Emergency Department Mini Assessment   Patient Details  Name: Alexander Davis MRN: [id number] Date of Birth: [birth year]  Transition of Care [facility]) CM/SW Contact:    Alexander JAYSON Canary, RN Phone Number: 12/23/2023, 2:20 PM   Clinical Narrative:  Patient presented for increase in [facility]. He is cared for at home by his wife. He has a trach and a PEG. She is interested in any service that will assist her. Alexander Davis for acceptance of HH RN, Aide They cannot do but referred me to another branch. Faxed over Demographic sheet  and orders to Alexander Davis at [phone]. Phone for contact is [phone]  ED Mini Assessment: What brought you to the Emergency Department? : [facility] increased secretions  Barriers to Discharge: No Home Care Agency will accept this patient  Barrier interventions: continue to call agencies     Interventions which prevented an admission or readmission: Home Health Consult or Services    Patient Contact and Communications        ,                 Admission diagnosis:  SHOB; Trach Pt Patient Active Problem List   Diagnosis Date Noted   Tracheostomy in place [facility]) 06/30/2023   Gastrostomy tube in place [facility]) 06/30/2023   Chronic systolic heart failure (HCC) 06/28/2023   Failure to thrive in adult 01/19/2023   Anoxic encephalopathy (HCC) 12/27/2022   Seizures (HCC) 12/27/2022   Ventricular tachycardia (HCC) 12/22/2022   Hx of respiratory failure 12/22/2022   Hx of cardiac arrest 12/22/2022   Hx of ST elevation myocardial infarction 12/22/2022   Mild cardiomegaly 02/20/2022   Aneurysm of ascending aorta without rupture (HCC) 02/20/2022   Vertigo 11/11/2019   Family history of prostate cancer in father 01/19/2017   Essential hypertension 02/12/2015   PCP:  Alexander Davis, Alexander Davis Pharmacy:   [id number] - [place], [place] - [address] [place] [address] Phone: [phone] Fax:  [phone]  [id number] [place], [place] - [address] AT [facility] [address] [place]  [address] Phone: [phone] Fax: [phone]

## 2023-12-23 NOTE — ED Triage Notes (Signed)
 PT arrives via EMS from from home. Wife called EMS this morning due to patient having increased sob and increased increased amount of secretions from his trach. RT at bedside performing suction at this time. PT arrives awake, but is reportedly nonverbal and does not follow any commands at baseline due to anoxic brain injury. EMS administered 1 nebulized treatment prior to arrival.

## 2023-12-24 NOTE — Telephone Encounter (Signed)
 Please review.  KP

## 2024-01-06 ENCOUNTER — Other Ambulatory Visit: Payer: Self-pay | Admitting: Internal Medicine

## 2024-01-06 DIAGNOSIS — K5901 Slow transit constipation: Secondary | ICD-10-CM

## 2024-01-06 DIAGNOSIS — R569 Unspecified convulsions: Secondary | ICD-10-CM

## 2024-01-07 ENCOUNTER — Other Ambulatory Visit: Payer: Self-pay

## 2024-01-07 NOTE — Telephone Encounter (Signed)
 Requested medications are due for refill today.  yes  Requested medications are on the active medications list.  yes  Last refill. varied  Future visit scheduled.   no  Notes to clinic.  No pcp listed.    Requested Prescriptions  Pending Prescriptions Disp Refills   valproic  acid (DEPAKENE ) 250 MG/5ML solution [Pharmacy Med Name: Valproic  Acid 250 MG/5ML Oral Solution] 800 mL 0    Sig: PLACE INTO THE FEEDING TUBE EVERY SIX HOURS     Neurology:  Anticonvulsants - Valproates Failed - 01/07/2024 12:11 PM      Failed - HGB in normal range and within 360 days    Hemoglobin  Date Value Ref Range Status  12/23/2023 12.9 (L) 13.0 - 17.0 g/dL Final         Failed - PLT in normal range and within 360 days    Platelets  Date Value Ref Range Status  12/23/2023 117 (L) 150 - 400 K/uL Final         Failed - HCT in normal range and within 360 days    HCT  Date Value Ref Range Status  12/23/2023 38.0 (L) 39.0 - 52.0 % Final         Failed - Valproic  Acid (serum) in normal range and within 360 days    No results found for: VALPROATE, VPAT       Failed - Completed PHQ-2 or PHQ-9 in the last 360 days      Passed - AST in normal range and within 360 days    AST  Date Value Ref Range Status  12/23/2023 28 15 - 41 U/L Final         Passed - ALT in normal range and within 360 days    ALT  Date Value Ref Range Status  12/23/2023 23 0 - 44 U/L Final         Passed - WBC in normal range and within 360 days    WBC  Date Value Ref Range Status  12/23/2023 6.1 4.0 - 10.5 K/uL Final         Passed - Patient is not pregnant      Passed - Valid encounter within last 12 months    Recent Outpatient Visits           6 months ago Anoxic encephalopathy (HCC)   [facility], Leita DEL, MD               lactulose  (CHRONULAC ) 10 GM/15ML solution [Pharmacy Med Name: LACTULOSE  10GM/15ML SOL] 900 mL 0    Sig: PLACE  INTO FEEDING TUBE DAILY     Gastroenterology:  Laxatives - lactulose  Passed - 01/07/2024 12:11 PM      Passed - Cl in normal range and within 360 days    Chloride  Date Value Ref Range Status  12/23/2023 102 98 - 111 mmol/L Final         Passed - CO2 in normal range and within 360 days    CO2  Date Value Ref Range Status  12/23/2023 26 22 - 32 mmol/L Final         Passed - K in normal range and within 360 days    Potassium  Date Value Ref Range Status  12/23/2023 3.5 3.5 - 5.1 mmol/L Final         Passed - Na in normal range and within 360 days    Sodium  Date Value Ref Range Status  12/23/2023 143 135 - 145 mmol/L Final  06/22/2023 142 137 - 147 Final         Passed - Valid encounter within last 12 months    Recent Outpatient Visits           6 months ago Anoxic encephalopathy [facility])   [place] [facility] at [facility], Leita DEL, MD

## 2024-01-12 ENCOUNTER — Other Ambulatory Visit: Payer: Self-pay

## 2024-01-12 DIAGNOSIS — R569 Unspecified convulsions: Secondary | ICD-10-CM
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
## 2024-01-12 MED ORDER — LEVETIRACETAM 100 MG/ML PO SOLN: 1000.0000 mg | Freq: Two times a day (BID) | ORAL | 1 refills | Status: AC

## 2024-01-21 ENCOUNTER — Other Ambulatory Visit: Payer: Self-pay | Admitting: Internal Medicine

## 2024-01-21 ENCOUNTER — Other Ambulatory Visit: Payer: Self-pay

## 2024-01-21 DIAGNOSIS — I5022 Chronic systolic (congestive) heart failure: Secondary | ICD-10-CM

## 2024-01-21 DIAGNOSIS — I252 Old myocardial infarction: Secondary | ICD-10-CM

## 2024-01-21 DIAGNOSIS — I472 Ventricular tachycardia, unspecified: Secondary | ICD-10-CM

## 2024-01-21 DIAGNOSIS — I10 Essential (primary) hypertension: Secondary | ICD-10-CM

## 2024-01-21 DIAGNOSIS — I7121 Aneurysm of the ascending aorta, without rupture: Secondary | ICD-10-CM

## 2024-01-21 MED ORDER — ATORVASTATIN CALCIUM 80 MG PO TABS: 80.0000 mg | ORAL_TABLET | Freq: Every day | ORAL | 1 refills | Status: AC

## 2024-01-21 MED ORDER — AMLODIPINE BESYLATE 5 MG PO TABS: 5.0000 mg | ORAL_TABLET | Freq: Two times a day (BID) | ORAL | 1 refills | Status: AC

## 2024-01-21 MED ORDER — CARVEDILOL 3.125 MG PO TABS: 6.2500 mg | ORAL_TABLET | Freq: Two times a day (BID) | ORAL | 1 refills | Status: AC

## 2024-01-21 MED ORDER — PRASUGREL HCL 10 MG PO TABS: 10.0000 mg | ORAL_TABLET | Freq: Every day | ORAL | 1 refills | Status: AC

## 2024-01-21 MED ORDER — FUROSEMIDE 40 MG PO TABS: 40.0000 mg | ORAL_TABLET | Freq: Every day | ORAL | 1 refills | Status: AC

## 2024-02-04 ENCOUNTER — Emergency Department
Admission: EM | Admit: 2024-02-04 | Discharge: 2024-02-05 | Disposition: A | Discharge status: Home / Self Care | Attending: Emergency Medicine | Admitting: Emergency Medicine

## 2024-02-04 DIAGNOSIS — S01511A Laceration without foreign body of lip, initial encounter: Secondary | ICD-10-CM | POA: Insufficient documentation

## 2024-02-04 DIAGNOSIS — W230XXA Caught, crushed, jammed, or pinched between moving objects, initial encounter: Secondary | ICD-10-CM | POA: Insufficient documentation

## 2024-02-04 DIAGNOSIS — S0993XA Unspecified injury of face, initial encounter: Secondary | ICD-10-CM | POA: Diagnosis present

## 2024-02-04 MED ORDER — LIDOCAINE HCL (PF) 1 % IJ SOLN
5.0000 mL | Freq: Once | INTRAMUSCULAR | Status: AC
Start: 2024-02-04 — End: 2024-02-05
  Administered 2024-02-05: 5 mL
  Filled 2024-02-04: qty 5

## 2024-02-04 MED ORDER — LIDOCAINE 4 % EX CREA
TOPICAL_CREAM | Freq: Once | CUTANEOUS | Status: AC
  Administered 2024-02-05: 1 via TOPICAL
  Filled 2024-02-04: qty 5

## 2024-02-04 NOTE — ED Notes (Signed)
 Spoke to patient's wife. She would like to know if patient can get something for pain as he is unable to express when he hurts.   She also stated that pt was also taking eye drops for possible conjunctivitis but after eye redness/bleeding, swelling, and exudate she was told to stop them by their provider. Pt swelling has since gone down but exudate is still present.  Informed provider

## 2024-02-04 NOTE — ED Provider Notes (Signed)
 [facility] Provider Note    Event Date/Time   First MD Initiated Contact with Patient 02/04/24 2301     (approximate)   History   Aspiration   HPI  Alexander Davis is a 63 y.o. male who presents to the ED for evaluation of Aspiration   Review an outside ED visit from August.  Patient is trach and PEG dependent, history of anoxic brain injury.  Nonverbal.   Patient presents to the ED from home via EMS for evaluation of biting his lip.  He presents with his brother and sister-in-law who are caring for him temporarily as patient's wife is out of town for an event.  Brother and sister-in-law are accustomed to caring for the patient.   They report that after his heart attack 1 year ago he has been in this clinical state.  He often yawns and after a large yawn will frequently bite his lower lip, reports this was a bad one tonight.  Around 9 PM they were at home with him giving his nighttime medications when he yawned and bit his lower lip.  They report profuse bleeding a large laceration to bring him to the ED.  Patient unable to provide any relevant history  He is on base line blow-by oxygen via tracheostomy   Physical Exam   Triage Vital Signs: ED Triage Vitals  Encounter Vitals Group     BP 02/04/24 2235 (!) 139/104     Girls Systolic BP Percentile --      Girls Diastolic BP Percentile --      Boys Systolic BP Percentile --      Boys Diastolic BP Percentile --      Pulse Rate 02/04/24 2231 90     Resp 02/04/24 2231 20     Temp 02/04/24 2231 98 F (36.7 C)     Temp Source 02/04/24 2231 Axillary     SpO2 02/04/24 2231 100 %     Weight --      Height --      Head Circumference --      Peak Flow --      Pain Score 02/04/24 2229 0     Pain Loc --      Pain Education --      Exclude from Growth Chart --     Most recent vital signs: Vitals:   02/05/24 0230 02/05/24 0300  BP: (!) 119/97 113/79  Pulse: 100 93  Resp: 20 (!) 22  Temp:     SpO2: 100% 98%    General: Awake, no distress.  CV:  Good peripheral perfusion.  Resp:  Normal effort.  Abd:  No distention.  MSK:  No deformity noted.  Neuro:  No focal deficits appreciated. Other:  To the mucosal surface of midline lower lip he does have a small gaping laceration to the subcutaneous tissue that is hemostatic, about 3 cm in length, no full-thickness laceration.   Does have signs of bacterial conjunctivitis on the right side without signs of EOM entrapment, proptosis or open globe or further concerns   ED Results / Procedures / Treatments   Labs (all labs ordered are listed, but only abnormal results are displayed) Labs Reviewed - No data to display  EKG   RADIOLOGY   Official radiology report(s): No results found.  PROCEDURES and INTERVENTIONS:  .Laceration Repair  Date/Time: 02/05/2024 3:24 AM  Performed by: Claudene Rover, MD Authorized by: Claudene Rover, MD   Consent:  Consent obtained:  Verbal   Consent given by:  Healthcare agent, guardian and spouse   Risks, benefits, and alternatives were discussed: yes   Anesthesia:    Anesthesia method:  Local infiltration   Local anesthetic:  Lidocaine 1% w/o epi and lidocaine 2% WITH epi Laceration details:    Location:  Lip   Lip location:  Lower interior lip   Length (cm):  3 Exploration:    Hemostasis achieved with:  Epinephrine and direct pressure Treatment:    Amount of cleaning:  Standard   Irrigation solution:  Sterile saline   Visualized foreign bodies/material removed: no   Skin repair:    Repair method:  Sutures   Suture size:  4-0   Wound skin closure material used: monocryl.   Suture technique:  Horizontal mattress and simple interrupted   Number of sutures:  4 (x3 horizontal mattress, x1 simple interrupted) Approximation:    Approximation:  Close   Vermilion border well-aligned: yes   Repair type:    Repair type:  Intermediate Post-procedure details:    Dressing:  Open (no  dressing)   Procedure completion:  Tolerated well, no immediate complications   Medications  lidocaine-EPINEPHrine (XYLOCAINE W/EPI) 2 %-1:100000 (with pres) injection 20 mL (20 mLs Infiltration Not Given 02/05/24 0301)  lidocaine (LMX) 4 % cream (1 Application Topical Given 02/05/24 0007)  lidocaine (PF) (XYLOCAINE) 1 % injection 5 mL (5 mLs Infiltration Given 02/05/24 0007)  lidocaine-EPINEPHrine (XYLOCAINE W/EPI) 2 %-1:200000 (PF) injection (20 mLs  Given 02/05/24 0131)     IMPRESSION / MDM / ASSESSMENT AND PLAN / ED COURSE  I reviewed the triage vital signs and the nursing notes.  Differential diagnosis includes, but is not limited to, partial-thickness laceration, full-thickness laceration, aspiration pneumonitis  Patient presents after biting his lip.  Now hemostatic and it is gaping on the mucosal surface and will require suture repair.  Will start with topical lidocaine, then infiltrate lidocaine and place absorbable suture as described above.  Family is agreeable with this plan of care.  We discussed possible preventative measures as this seems to be a recurring issue for the patient, we discussed possible custom dental/mouth guards for his teeth.  He is on his baseline blow-by oxygen.  Has already been suctioned 1 time with RT.  After suture repair we will suction his trach site again and ensure he is suitable for outpatient management, which I suspect he will be.  Suture repair was well-tolerated.  Does have some bleeding afterwards that resolves with lido with epi.   RT is helpful for suctioning, exchanging inner cannula of his trach.   We discussed local wound care at home, ED return precautions.  We discussed continuing the Polytrim eyedrops that he was just prescribed 1 day ago for right sided bacterial conjunctivitis.  Clinical Course as of 02/05/24 0326  Sat Feb 05, 2024  0110 4 sutures, 4-0 monocryl. X3 horizontal mattress, x1 simple interrupted [DS]  0143 4cc 2%  lido w epi with quick cessation of bleeding [DS]  0237 RT has evaluated the patient, cleaned up his trach, suctioning [DS]    Clinical Course User Index [DS] Claudene Rover, MD     FINAL CLINICAL IMPRESSION(S) / ED DIAGNOSES   Final diagnoses:  Lip laceration, initial encounter     Rx / DC Orders   ED Discharge Orders     None        Note:  This document was prepared using Dragon voice recognition software and may include  unintentional dictation errors.   Claudene Rover, MD 02/05/24 [id number]

## 2024-02-04 NOTE — ED Triage Notes (Signed)
 Pt to ED from home. Pt bit his lip 1 hour ago, pt has trach and cannot swallow, pt aspirated blood. EMS suctioned trach, pt able to cough. Rhonchi heard in all lung fields by EMS.

## 2024-02-04 NOTE — ED Notes (Signed)
 Called respiratory to assess patient. Family arrived and at bedside.

## 2024-02-05 MED ORDER — LIDOCAINE-EPINEPHRINE (PF) 2 %-1:200000 IJ SOLN
INTRAMUSCULAR | Status: AC
  Administered 2024-02-05: 20 mL
  Filled 2024-02-05: qty 20

## 2024-02-05 MED ORDER — LIDOCAINE-EPINEPHRINE 2 %-1:100000 IJ SOLN
20.0000 mL | Freq: Once | INTRAMUSCULAR | Status: DC
  Filled 2024-02-05: qty 1

## 2024-02-05 NOTE — Discharge Instructions (Addendum)
 We placed 4 stitches in his lower lip that will absorb on their own  Gently rinse his mouth as needed, try not to pull out his lower lip to aggressively - do not want to accidentally dislodge a stitch  Use the numbing cream we gave you 2-3 times per day as needed for pain, apply directly to the area  Continue to use the Polytrim eyedrops to his right eye, would complete at least 5 days of this medicine  As we discussed, consider custom mouthguard from a dentist to help protect his mouth from biting in the future  Return to the ED with any further concerns

## 2024-02-05 NOTE — Plan of Care (Signed)
 Trach care done for the 2nd time. Bloody secretions noted. Patient remains on trach collar with 5L via venti. No further interventions at this time.

## 2024-02-09 ENCOUNTER — Other Ambulatory Visit: Payer: Self-pay | Admitting: Internal Medicine

## 2024-02-09 DIAGNOSIS — R569 Unspecified convulsions: Secondary | ICD-10-CM

## 2024-02-09 DIAGNOSIS — K5901 Slow transit constipation: Secondary | ICD-10-CM

## 2024-02-09 MED ORDER — LACTULOSE 10 GM/15ML PO SOLN: 20.0000 g | Freq: Every day | ORAL | 1 refills | Status: AC

## 2024-02-09 MED ORDER — VALPROIC ACID 250 MG/5ML PO SOLN: ORAL | 0 refills | Status: AC

## 2024-02-09 NOTE — Progress Notes (Unsigned)
 Date:  02/09/2024   Name:  Alexander Davis   DOB:  27-Oct-1960   MRN:  [id number]   Chief Complaint: No chief complaint on file.  HPI  Review of Systems   Lab Results  Component Value Date   NA 143 12/23/2023   K 3.5 12/23/2023   CO2 26 12/23/2023   GLUCOSE 193 (H) 12/23/2023   BUN 16 12/23/2023   CREATININE 0.70 12/23/2023   CALCIUM  8.8 (L) 12/23/2023   EGFR >60 06/22/2023   GFRNONAA >60 12/23/2023   No results found for: CHOL, HDL, LDLCALC, LDLDIRECT, TRIG, CHOLHDL Lab Results  Component Value Date   TSH 1.481 01/19/2017   No results found for: HGBA1C Lab Results  Component Value Date   WBC 6.1 12/23/2023   HGB 12.9 (L) 12/23/2023   HCT 38.0 (L) 12/23/2023   MCV 100.3 (H) 12/23/2023   PLT 117 (L) 12/23/2023   Lab Results  Component Value Date   ALT 23 12/23/2023   AST 28 12/23/2023   ALKPHOS 104 12/23/2023   BILITOT 0.7 12/23/2023   No results found for: MARIEN BOLLS, VD25OH   Patient Active Problem List   Diagnosis Date Noted   Tracheostomy in place [facility]) 06/30/2023   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) 06/30/2023   Chronic systolic heart failure (HCC) 06/28/2023   Failure to thrive in adult 01/19/2023   Anoxic encephalopathy (HCC) 12/27/2022   Seizures (HCC) 12/27/2022   Ventricular tachycardia (HCC) 12/22/2022   Hx of respiratory failure 12/22/2022   Hx of cardiac arrest 12/22/2022   Hx of ST elevation myocardial infarction 12/22/2022   Mild cardiomegaly 02/20/2022   Aneurysm of ascending aorta without rupture 02/20/2022   Vertigo 11/11/2019   Family history of prostate cancer in father 01/19/2017   Essential hypertension 02/12/2015    Allergies  Allergen Reactions   Diltiazem  Swelling    Lip swelling   Fish Allergy Anaphylaxis   Peanut-Containing Drug Products Anaphylaxis and Swelling    Lips swell   Penicillins Anaphylaxis    Has patient had a PCN reaction causing immediate rash, facial/tongue/throat  swelling, SOB or lightheadedness with hypotension: Unknown Has patient had a PCN reaction causing severe rash involving mucus membranes or skin necrosis: Unknown Has patient had a PCN reaction that required hospitalization: Unknown Has patient had a PCN reaction occurring within the last 10 years: No If all of the above answers are NO, then may proceed with Cephalosporin use.     Hctz [Hydrochlorothiazide ] Other (See Comments)    Muscle cramps    Past Surgical History:  Procedure Laterality Date   HERNIA REPAIR     age 63    Social History   Tobacco Use   Smoking status: Never   Smokeless tobacco: Never  Vaping Use   Vaping status: Never Used  Substance Use Topics   Alcohol use: No    Alcohol/week: 0.0 standard drinks of alcohol   Drug use: No     Medication list has been reviewed and updated.  No outpatient medications have been marked as taking for the 02/09/24 encounter (Orders Only) with Justus Leita DEL, MD.       10/14/2022    2:31 PM 07/28/2022    4:04 PM 12/17/2021    1:41 PM 08/29/2021   11:22 AM  GAD 7 : Generalized Anxiety Score  Nervous, Anxious, on Edge 1 0 0 0  Control/stop worrying 1 0 0 0  Worry too much - different things 1 1 1  1  Trouble relaxing 0 0 0 0  Restless 0 0 0 0  Easily annoyed or irritable 0 0 0 0  Afraid - awful might happen 0 1 1 0  Total GAD 7 Score 3 2 2 1   Anxiety Difficulty Not difficult at all Not difficult at all Not difficult at all Somewhat difficult       10/14/2022    2:31 PM 07/28/2022    4:04 PM 12/17/2021    1:41 PM  Depression screen PHQ 2/9  Decreased Interest 0 1 1  Down, Depressed, Hopeless 0 1 1  PHQ - 2 Score 0 2 2  Altered sleeping 0 1 0  Tired, decreased energy 0 1 1  Change in appetite 0 1 1  Feeling bad or failure about yourself  0 1 1  Trouble concentrating 0 0 0  Moving slowly or fidgety/restless 0 0 0  Suicidal thoughts 0 0 0  PHQ-9 Score 0 6 5  Difficult doing work/chores Not difficult at all  Somewhat difficult Not difficult at all    BP Readings from Last 3 Encounters:  02/05/24 (!) 137/91  12/23/23 (!) 130/103  10/14/22 128/80    Physical Exam  Wt Readings from Last 3 Encounters:  06/30/23 243 lb (110.2 kg)  10/14/22 243 lb (110.2 kg)  07/28/22 242 lb (109.8 kg)    There were no vitals taken for this visit.  Assessment and Plan:  Problem List Items Addressed This Visit   None   No follow-ups on file.    Leita HILARIO Adie, MD [facility] [place]

## 2024-02-14 NOTE — Telephone Encounter (Signed)
 FYI  KP

## 2024-04-17 ENCOUNTER — Encounter (INDEPENDENT_AMBULATORY_CARE_PROVIDER_SITE_OTHER): Payer: Self-pay

## 2024-06-15 ENCOUNTER — Ambulatory Visit (HOSPITAL_COMMUNITY)
